# Patient Record
Sex: Male | Born: 1960 | Race: Black or African American | Hispanic: No | Marital: Single | Smoking: Current every day smoker
Health system: Southern US, Community
[De-identification: ages and names within clinical notes are randomized; demographics above are authoritative.]

## PROBLEM LIST (undated history)

## (undated) DIAGNOSIS — M109 Gout, unspecified: Secondary | ICD-10-CM

## (undated) DIAGNOSIS — I10 Essential (primary) hypertension: Secondary | ICD-10-CM

## (undated) HISTORY — DX: Gout, unspecified: M10.9

## (undated) HISTORY — DX: Essential (primary) hypertension: I10

---

## 2006-08-15 ENCOUNTER — Ambulatory Visit: Payer: Self-pay | Admitting: Family Medicine

## 2006-08-15 ENCOUNTER — Telehealth: Payer: Self-pay | Admitting: Family Medicine

## 2006-08-15 DIAGNOSIS — M79609 Pain in unspecified limb: Secondary | ICD-10-CM

## 2006-08-21 ENCOUNTER — Ambulatory Visit: Payer: Self-pay | Admitting: Family Medicine

## 2006-08-30 ENCOUNTER — Ambulatory Visit: Payer: Self-pay | Admitting: Family Medicine

## 2006-08-30 DIAGNOSIS — E669 Obesity, unspecified: Secondary | ICD-10-CM

## 2006-08-30 DIAGNOSIS — I10 Essential (primary) hypertension: Secondary | ICD-10-CM

## 2006-08-30 HISTORY — DX: Essential (primary) hypertension: I10

## 2006-09-25 ENCOUNTER — Encounter: Payer: Self-pay | Admitting: Family Medicine

## 2006-09-26 LAB — CONVERTED CEMR LAB
Albumin: 4.5 g/dL (ref 3.5–5.2)
Alkaline Phosphatase: 95 units/L (ref 39–117)
BUN: 16 mg/dL (ref 6–23)
CO2: 27 meq/L (ref 19–32)
Calcium: 10.4 mg/dL (ref 8.4–10.5)
Cholesterol: 186 mg/dL (ref 0–200)
Glucose, Bld: 84 mg/dL (ref 70–99)
HDL: 42 mg/dL (ref >39)
LDL Cholesterol: 119 mg/dL — ABNORMAL HIGH (ref 0–99)
Potassium: 4.4 meq/L (ref 3.5–5.3)
Triglycerides: 125 mg/dL (ref <150)

## 2006-12-03 ENCOUNTER — Ambulatory Visit: Payer: Self-pay | Admitting: Family Medicine

## 2006-12-03 DIAGNOSIS — M545 Low back pain: Secondary | ICD-10-CM

## 2006-12-20 ENCOUNTER — Telehealth (INDEPENDENT_AMBULATORY_CARE_PROVIDER_SITE_OTHER): Payer: Self-pay | Admitting: *Deleted

## 2006-12-20 ENCOUNTER — Encounter: Payer: Self-pay | Admitting: Family Medicine

## 2006-12-20 DIAGNOSIS — R252 Cramp and spasm: Secondary | ICD-10-CM | POA: Insufficient documentation

## 2006-12-21 ENCOUNTER — Telehealth (INDEPENDENT_AMBULATORY_CARE_PROVIDER_SITE_OTHER): Payer: Self-pay | Admitting: *Deleted

## 2006-12-24 ENCOUNTER — Encounter: Payer: Self-pay | Admitting: Family Medicine

## 2006-12-28 ENCOUNTER — Telehealth (INDEPENDENT_AMBULATORY_CARE_PROVIDER_SITE_OTHER): Payer: Self-pay | Admitting: *Deleted

## 2007-10-21 ENCOUNTER — Ambulatory Visit: Payer: Self-pay | Admitting: Family Medicine

## 2007-10-21 LAB — CONVERTED CEMR LAB
Alkaline Phosphatase: 99 units/L (ref 39–117)
BUN: 21 mg/dL (ref 6–23)
CO2: 21 meq/L (ref 19–32)
Cholesterol: 192 mg/dL (ref 0–200)
Glucose, Bld: 90 mg/dL (ref 70–99)
HDL: 47 mg/dL (ref >39)
LDL Cholesterol: 120 mg/dL — ABNORMAL HIGH (ref 0–99)
Total Bilirubin: 0.7 mg/dL (ref 0.3–1.2)
Triglycerides: 127 mg/dL (ref <150)
VLDL: 25 mg/dL (ref 0–40)

## 2007-10-22 ENCOUNTER — Encounter: Payer: Self-pay | Admitting: Family Medicine

## 2008-11-12 ENCOUNTER — Ambulatory Visit: Payer: Self-pay | Admitting: Family Medicine

## 2008-11-13 LAB — CONVERTED CEMR LAB

## 2010-04-25 ENCOUNTER — Ambulatory Visit: Payer: Self-pay | Admitting: Family Medicine

## 2010-04-25 DIAGNOSIS — N529 Male erectile dysfunction, unspecified: Secondary | ICD-10-CM

## 2010-04-27 LAB — CONVERTED CEMR LAB
ALT: 21 units/L (ref 0–53)
AST: 26 units/L (ref 0–37)
Alkaline Phosphatase: 79 units/L (ref 39–117)
BUN: 14 mg/dL (ref 6–23)
Calcium: 9.2 mg/dL (ref 8.4–10.5)
Chloride: 109 meq/L (ref 96–112)
Creatinine, Ser: 1.14 mg/dL (ref 0.40–1.50)
HDL: 40 mg/dL (ref >39)
LDL Cholesterol: 114 mg/dL — ABNORMAL HIGH (ref 0–99)
Magnesium: 1.9 mg/dL (ref 1.5–2.5)
Potassium: 4.6 meq/L (ref 3.5–5.3)
Sex Hormone Binding: 25 nmol/L (ref 13–71)
Testosterone Free: 61.3 pg/mL (ref 47.0–244.0)
Testosterone-% Free: 2.3 % (ref 1.6–2.9)
Total CHOL/HDL Ratio: 4.3
Uric Acid, Serum: 9.3 mg/dL — ABNORMAL HIGH (ref 4.0–7.8)

## 2010-07-19 ENCOUNTER — Ambulatory Visit: Payer: Self-pay | Admitting: Family Medicine

## 2010-10-16 LAB — CONVERTED CEMR LAB
BUN: 15 mg/dL (ref 6–23)
CO2: 22 meq/L (ref 19–32)
Calcium: 9.4 mg/dL (ref 8.4–10.5)
Creatinine, Ser: 1.24 mg/dL (ref 0.40–1.50)
Glucose, Bld: 90 mg/dL (ref 70–99)

## 2010-10-20 NOTE — Assessment & Plan Note (Signed)
Summary: f/u HTN   Vital Signs:  Patient profile:   50 year old male Height:      72 inches Weight:      256 pounds BMI:     34.85 O2 Sat:      96 % on Room air Pulse rate:   95 / minute BP sitting:   141 / 83  (left arm) Cuff size:   large  Vitals Entered By: Payton Spark CMA (April 25, 2010 8:29 AM)  O2 Flow:  Room air CC: F/U HTN, Hypertension Management   CC:  F/U HTN and Hypertension Management.  Hypertension History:      He complains of visual symptoms and side effects from treatment, but denies headache, chest pain, palpitations, dyspnea with exertion, peripheral edema, neurologic problems, and syncope.  He notes the following problems with antihypertensive medication side effects: Having some muscle cramps and bilat ankle edema.   Also having ED.        Positive major cardiovascular risk factors include male age 69 years old or older, hypertension, and current tobacco user.  Negative major cardiovascular risk factors include no history of diabetes or hyperlipidemia and negative family history for ischemic heart disease.        Further assessment for target organ damage reveals no history of ASHD, cardiac end-organ damage (CHF/LVH), stroke/TIA, peripheral vascular disease, renal insufficiency, or hypertensive retinopathy.     Current Medications (verified): 1)  Enalapril-Hydrochlorothiazide 10-25 Mg Tabs (Enalapril-Hydrochlorothiazide) .Marland Kitchen.. 1 Tab By Mouth Daily  Allergies (verified): 1)  ! Pcn  Past History:  Past Medical History: Reviewed history from 12/03/2006 and no changes required. HTN-   Family History: Reviewed history from 08/30/2006 and no changes required. Mother HTN, DM Father unknown sister died from brain aneurysm at 24 brother died of heart dz has 5 living sibblings  Social History: Reviewed history from 08/30/2006 and no changes required. 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  He is single, sexually  active.  Smokes 1 ppd x 27 yrs.  Does not exercise.  Eats a lot of fast food.  2 beers/ wk.  Review of Systems      See HPI  Physical Exam  General:  alert, well-developed, well-nourished, and well-hydrated.   Head:  normocephalic and atraumatic.   Eyes:  pupils equal, pupils round, and pupils reactive to light.   Mouth:  pharynx pink and moist.   Neck:  no masses.   Lungs:  Normal respiratory effort, chest expands symmetrically. Lungs are clear to auscultation, no crackles or wheezes. Heart:  Normal rate and regular rhythm. S1 and S2 normal without gallop, murmur, click, rub or other extra sounds. Msk:  localized edema without erythema over the R tibiotalar joint with normal gait Pulses:  2+ radial and pedal pulses Extremities:  no LE edema  Skin:  color normal.   Cervical Nodes:  No lymphadenopathy noted Psych:  good eye contact, not anxious appearing, and not depressed appearing.     Impression & Recommendations:  Problem # 1:  HYPERTENSION, BENIGN ESSENTIAL (ICD-401.1) BP improved but SBP is still >140 and he is having ED from Enalapril/ HCTZ.  I will change him to Bystolic 10 mg/ day -- samples given x 4 wks.  Call if ED has improved after 3 wks and will send in RX.  REcheck BP at CPE in 3 mos.  Continue to work on low sodium diet, exercise, wt loss and will need a dilated  eye exam to look for hypertensive retinopathy and screen for glaucoma.  Update fasting labs. His updated medication list for this problem includes:    Bystolic 10 Mg Tabs (Nebivolol hcl) .Marland Kitchen... 1 tab by mouth daily  Orders: T-Comprehensive Metabolic Panel [id number]) Ophthalmology Referral (Ophthalmology)  BP today: 141/83 Prior BP: 155/89 (11/12/2008)  10 Yr Risk Heart Disease: 18 %  Labs Reviewed: K+: 4.3 (10/21/2007) Creat: : 1.22 (10/21/2007)   Chol: 192 (10/21/2007)   HDL: 47 (10/21/2007)   LDL: 120 (10/21/2007)   TG: 127 (10/21/2007)  Problem # 2:  ERECTILE DYSFUNCTION, ORGANIC  (ICD-607.84) Likely med SE or from HTN and smoking.  Will check a testosterone with his labs and change his BP medication to see if this helps.  Encouraged smoking cessation.   Orders: T-Testosterone, Free and Total [phone])  Problem # 3:  MUSCLE CRAMPS (ICD-729.82) Likely from use of HCTZ with sweating at work.  Check electrolytes with a mag level today.  Stop HCTZ. Orders: T-Magnesium ([id number])  Problem # 4:  OBESITY NOS (ICD-278.00) BMI 34 but he is a muscular build.  Encouraged healthy diet, regular exercise.  Complete Medication List: 1)  Bystolic 10 Mg Tabs (Nebivolol hcl) .Marland Kitchen.. 1 tab by mouth daily  Other Orders: T-Lipid Profile ([id number]) T-Uric Acid (Blood) [phone])  Hypertension Assessment/Plan:      The patient's hypertensive risk group is category B: At least one risk factor (excluding diabetes) with no target organ damage.  His calculated 10 year risk of coronary heart disease is 18 %.  Today's blood pressure is 141/83.  His blood pressure goal is < 140/90.  Patient Instructions: 1)  Labs today. 2)  Will call you w/ results tomorrow. 3)  Change Enalapril/HCTZ to Bystolic - 1 tab daily for high BP. 4)  This should improve ED problems.  Let me know if you want to STAY on this for BP after 3 wks and I will send RX to your pharmacy. 5)  Return for a PHYSICAL in 3 mos.

## 2010-10-20 NOTE — Assessment & Plan Note (Signed)
Summary: f/u HTN   Vital Signs:  Patient profile:   50 year old male Height:      72 inches Weight:      260 pounds BMI:     35.39 O2 Sat:      97 % on Room air Pulse rate:   79 / minute BP sitting:   143 / 91  (left arm) Cuff size:   large  Vitals Entered By: Payton Spark CMA (July 19, 2010 10:31 AM)  O2 Flow:  Room air CC: F/U. Discuss BP meds., Hypertension Management   CC:  F/U. Discuss BP meds. and Hypertension Management.  Hypertension History:      He denies headache, chest pain, palpitations, dyspnea with exertion, orthopnea, PND, peripheral edema, and side effects from treatment.  He notes no problems with any antihypertensive medication side effects.  He is still smoking and having ED problems.  He refused to use testosterone even though it was low.  He felt better when he was on the Enalapril/ HCTZ.  Changing to Bystolic did not help the ED problems.  Marland Kitchen        Positive major cardiovascular risk factors include male age 27 years old or older, hypertension, and current tobacco user.  Negative major cardiovascular risk factors include no history of diabetes or hyperlipidemia and negative family history for ischemic heart disease.        Further assessment for target organ damage reveals no history of ASHD, cardiac end-organ damage (CHF/LVH), stroke/TIA, peripheral vascular disease, renal insufficiency, or hypertensive retinopathy.     Current Medications (verified): 1)  Bystolic 10 Mg Tabs (Nebivolol Hcl) .Marland Kitchen.. 1 Tab By Mouth Daily  Allergies (verified): 1)  ! Pcn  Past History:  Past Medical History: HTN-  ED  Family History: Reviewed history from 08/30/2006 and no changes required. Mother HTN, DM Father unknown sister died from brain aneurysm at 82 brother died of heart dz has 5 living sibblings  Social History: Reviewed history from 08/30/2006 and no changes required. 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  He is  single, sexually active.  Smokes 1 ppd x 27 yrs.  Does not exercise.  Eats a lot of fast food.  2 beers/ wk.  Review of Systems      See HPI  Physical Exam  General:  alert, well-developed, well-nourished, and well-hydrated.   Head:  normocephalic and atraumatic.   Eyes:  pupils equal, pupils round, and pupils reactive to light.   Mouth:  good dentition and pharynx pink and moist.   Neck:  no masses.   Lungs:  Normal respiratory effort, chest expands symmetrically. Lungs are clear to auscultation, no crackles or wheezes. Heart:  Normal rate and regular rhythm. S1 and S2 normal without gallop, murmur, click, rub or other extra sounds. Pulses:  2+ radial pulses Extremities:  no LE edema Skin:  color normal.     Impression & Recommendations:  Problem # 1:  HYPERTENSION, BENIGN ESSENTIAL (ICD-401.1) BP high on Bystolic and has not had any improvement in ED so will change him back to Enalapril/ HCTZ.  Labs UTD. CPE in 3 mos. His updated medication list for this problem includes:    Enalapril-hydrochlorothiazide 10-25 Mg Tabs (Enalapril-hydrochlorothiazide) .Marland Kitchen... 1 tab by mouth daily  BP today: 143/91 Prior BP: 141/83 (04/25/2010)  10 Yr Risk Heart Disease: 22 % Prior 10 Yr Risk Heart Disease: 18 % (04/25/2010)  Labs Reviewed: K+: 4.6 (04/25/2010) Creat: :  1.14 (04/25/2010)   Chol: 172 (04/25/2010)   HDL: 40 (04/25/2010)   LDL: 114 (04/25/2010)   TG: 90 (04/25/2010)  Problem # 2:  ERECTILE DYSFUNCTION, ORGANIC (ICD-607.84) Did not improve with change of BP medicine.  Had a low testosterone but he refused treatment. His smoking and HTN are likely contributing factors.  Will try Cialis as needed.  Counseled on wt loss and smoking cessation. His updated medication list for this problem includes:    Cialis 20 Mg Tabs (Tadalafil) .Marland Kitchen... 1 tab by mouth x 1 prn  Complete Medication List: 1)  Enalapril-hydrochlorothiazide 10-25 Mg Tabs (Enalapril-hydrochlorothiazide) .Marland Kitchen.. 1 tab by mouth  daily 2)  Cialis 20 Mg Tabs (Tadalafil) .Marland Kitchen.. 1 tab by mouth x 1 prn  Hypertension Assessment/Plan:      The patient's hypertensive risk group is category B: At least one risk factor (excluding diabetes) with no target organ damage.  His calculated 10 year risk of coronary heart disease is 22 %.  Today's blood pressure is 143/91.  His blood pressure goal is < 140/90.  Patient Instructions: 1)  Change BP medicine back to Enalapril/ HCTZ once daily. 2)  Try Cialis for ED.  You can cut in 1/2 and take 30 min prior to sexual activity. 3)  Work on smoking cessation. 4)  Set up a PHYSICAL in 3 mos. Prescriptions: CIALIS 20 MG TABS (TADALAFIL) 1 tab by mouth x 1 prn  #15 x 2   Entered and Authorized by:   Seymour Bars DO   Signed by:   Seymour Bars DO on 07/19/2010   Method used:   Electronically to        [facility] [phone][facility] (retail)       [address]       [place], [place]  [address]       Ph: [phone]       Fax: [phone]   RxID:   [id number] ENALAPRIL-HYDROCHLOROTHIAZIDE 10-25 MG TABS (ENALAPRIL-HYDROCHLOROTHIAZIDE) 1 tab by mouth daily  #90 x 1   Entered and Authorized by:   Seymour Bars DO   Signed by:   Seymour Bars DO on 07/19/2010   Method used:   Electronically to        [facility] [phone][facility] (retail)       [address]       [place], [place]  [address]       Ph: [phone]       Fax: [phone]   RxID:   [id number]    Orders Added: 1)  Est. Patient Level III [09323]

## 2010-11-02 ENCOUNTER — Ambulatory Visit (INDEPENDENT_AMBULATORY_CARE_PROVIDER_SITE_OTHER): Payer: BC Managed Care – PPO | Admitting: Family Medicine

## 2010-11-02 ENCOUNTER — Encounter: Payer: Self-pay | Admitting: Family Medicine

## 2010-11-02 DIAGNOSIS — J069 Acute upper respiratory infection, unspecified: Secondary | ICD-10-CM | POA: Insufficient documentation

## 2010-11-09 NOTE — Assessment & Plan Note (Signed)
Summary: URI   Vital Signs:  Patient profile:   50 year old male Height:      72 inches Weight:      258 pounds BMI:     35.12 O2 Sat:      96 % on Room air Temp:     98.4 degrees F oral Pulse rate:   115 / minute BP sitting:   157 / 93  (left arm) Cuff size:   large  Vitals Entered By: Payton Spark CMA (November 02, 2010 8:49 AM)  O2 Flow:  Room air CC: Head and chest congestion x weeks.   Primary Care Provider:  Seymour Bars DO  CC:  Head and chest congestion x weeks.Marland Kitchen  History of Present Illness: 50 yo AAM presents for congestion x 3 wks.  He is having a lot of rhinorrhea mixed with some blood.  Taking Nyquil and nothing during the day.  He has not had fevers or GI upset.  no  sore throat.  He has a little dry cough but no SOB or chest tightness.  Denies ocular or ear symptoms.  He has not been taking his BP meds the past 3 days.    Current Medications (verified): 1)  Enalapril-Hydrochlorothiazide 10-25 Mg Tabs (Enalapril-Hydrochlorothiazide) .Marland Kitchen.. 1 Tab By Mouth Daily 2)  Cialis 20 Mg Tabs (Tadalafil) .Marland Kitchen.. 1 Tab By Mouth X 1 Prn  Allergies (verified): 1)  ! Pcn  Past History:  Past Medical History: Reviewed history from 07/19/2010 and no changes required. HTN-  ED  Family History: Reviewed history from 08/30/2006 and no changes required. Mother HTN, DM Father unknown sister died from brain aneurysm at 39 brother died of heart dz has 5 living sibblings  Social History: Reviewed history from 08/30/2006 and no changes required. 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  He is single, sexually active.  Smokes 1 ppd x 27 yrs.  Does not exercise.  Eats a lot of fast food.  2 beers/ wk.  Review of Systems      See HPI  Physical Exam  General:  alert, well-developed, well-nourished, and well-hydrated.   Head:  sinuses NTTP Eyes:  conjunctiva clear Ears:  EACs patent; TMs translucent and gray with good cone of light and bony  landmarks.  Nose:  nasal congestion, boggy turbinates Mouth:  o/p mildly injected Lungs:  Normal respiratory effort, chest expands symmetrically. Lungs are clear to auscultation, no crackles or wheezes. Heart:  Normal rate and regular rhythm. S1 and S2 normal without gallop, murmur, click, rub or other extra sounds. Skin:  color normal.   Cervical Nodes:  shoddy anterior cervical LN enlargement   Impression & Recommendations:  Problem # 1:  VIRAL URI (ICD-465.9)  Instructed on symptomatic treatment. Call if symptoms persist or worsen.   REstart BP meds and use Mucinex D in the morning for short term use only ( 7 days).  Nasal saline given. OK to use Nyquil at night and call if not improving after 7 days.  Complete Medication List: 1)  Enalapril-hydrochlorothiazide 10-25 Mg Tabs (Enalapril-hydrochlorothiazide) .Marland Kitchen.. 1 tab by mouth daily 2)  Cialis 20 Mg Tabs (Tadalafil) .Marland Kitchen.. 1 tab by mouth x 1 prn  Patient Instructions: 1)  Take your Enalapril everyday. 2)  Take OTC Mucinex -D in the morning (get from behind the pharmacy counter) and Nyquil is OK for nighttime. 3)  If not feeling better or getting worse after 7 days, please call me.  Orders Added: 1)  Est. Patient Level III [81859]

## 2010-12-02 ENCOUNTER — Telehealth (INDEPENDENT_AMBULATORY_CARE_PROVIDER_SITE_OTHER): Payer: Self-pay | Admitting: *Deleted

## 2010-12-15 NOTE — Progress Notes (Signed)
Summary: Sinus infection?  Phone Note Call from Patient Call back at [facility] Phone [phone]   Caller: Patient Reason for Call: Talk to Nurse Summary of Call: pt is still having a lot of congestion, mucus has changed colors, can Rx be called in for sinus infection? Initial call taken by: Lannette Donath,  December 02, 2010 3:25 PM  Follow-up for Phone Call        [facility] to pt.  Apologized for delay in return call.  Pt states he went to [facility] on Friday and received abx from them.  Pt to call with any other problems. Follow-up by: Francee Piccolo CMA Duncan Dull),  December 05, 2010 3:50 PM

## 2011-02-03 ENCOUNTER — Ambulatory Visit: Payer: BC Managed Care – PPO | Admitting: Family Medicine

## 2011-03-21 ENCOUNTER — Other Ambulatory Visit: Payer: Self-pay | Admitting: Family Medicine

## 2011-11-23 ENCOUNTER — Ambulatory Visit (INDEPENDENT_AMBULATORY_CARE_PROVIDER_SITE_OTHER): Payer: BC Managed Care – PPO | Admitting: Family Medicine

## 2011-11-23 ENCOUNTER — Encounter: Payer: Self-pay | Admitting: Family Medicine

## 2011-11-23 DIAGNOSIS — M549 Dorsalgia, unspecified: Secondary | ICD-10-CM

## 2011-11-23 DIAGNOSIS — Z87448 Personal history of other diseases of urinary system: Secondary | ICD-10-CM

## 2011-11-23 DIAGNOSIS — R319 Hematuria, unspecified: Secondary | ICD-10-CM

## 2011-11-23 DIAGNOSIS — I10 Essential (primary) hypertension: Secondary | ICD-10-CM

## 2011-11-23 MED ORDER — ENALAPRIL-HYDROCHLOROTHIAZIDE 10-25 MG PO TABS: 1.0000 | ORAL_TABLET | Freq: Every day | ORAL | Status: DC

## 2011-11-23 MED ORDER — MELOXICAM 15 MG PO TABS: 15.0000 mg | ORAL_TABLET | Freq: Every day | ORAL | Status: DC

## 2011-11-23 MED ORDER — ORPHENADRINE CITRATE ER 100 MG PO TB12: 100.0000 mg | ORAL_TABLET | Freq: Two times a day (BID) | ORAL | Status: DC

## 2011-11-23 NOTE — Progress Notes (Signed)
  Subjective:    Patient ID: Phillip Wyatt, male    DOB: 1961-03-27, 51 y.o.   MRN: [id number]  Hypertension This is a new problem. The current episode started more than 1 year ago. The problem is unchanged. Pertinent negatives include no anxiety, blurred vision, chest pain, headaches, malaise/fatigue, neck pain, orthopnea, palpitations, peripheral edema, PND, shortness of breath or sweats. Risk factors for coronary artery disease include male gender, smoking/tobacco exposure, sedentary lifestyle and stress. Past treatments include ACE inhibitors and diuretics. Compliance problems include exercise and medication side effects.  There is no history of angina, kidney disease, CAD/MI, CVA, heart failure, left ventricular hypertrophy, renovascular disease, retinopathy or a thyroid problem. There is no history of chronic renal disease, coarctation of the aorta, hyperaldosteronism, hypercortisolism, hyperparathyroidism, a hypertension causing med, pheochromocytoma or sleep apnea.   Admits he has not been compliant w/his medication   Review of Systems  Constitutional: Negative for malaise/fatigue.  HENT: Negative for neck pain.   Eyes: Negative for blurred vision.  Respiratory: Negative for shortness of breath.   Cardiovascular: Negative for chest pain, palpitations, orthopnea and PND.  Genitourinary: Positive for hematuria.       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       L back pain  Neurological: Negative for headaches.      BP 138/83  Pulse 103  Ht 6' (1.829 m)  Wt 265 lb (120.203 kg)  BMI 35.94 kg/m2  SpO2 97% Objective:   Physical Exam  Constitutional: He is oriented to person, place, and time. He appears well-developed. He appears distressed.  HENT:  Head: Normocephalic.  Neck: Normal range of motion. Neck supple.  Cardiovascular: Normal rate and regular rhythm.   Musculoskeletal:       No tenderness over R hip and back    Neurological: He is alert and oriented to person, place, and time. No cranial nerve deficit.  Skin: Skin is warm and dry.  Psychiatric: He has a normal mood and affect.          Assessment & Plan:  #1 Back pain   Norflex prn at night only and mobic 15 mg prn #2 hematuria  Check U/A and UC/S # 3 Hypertension continue w/verapamil

## 2011-11-23 NOTE — Patient Instructions (Signed)
Hypertension As your heart beats, it forces blood through your arteries. This force is your blood pressure. If the pressure is too high, it is called hypertension (HTN) or high blood pressure. HTN is dangerous because you may have it and not know it. High blood pressure may mean that your heart has to work harder to pump blood. Your arteries may be narrow or stiff. The extra work puts you at risk for heart disease, stroke, and other problems.  Blood pressure consists of two numbers, a higher number over a lower, 110/72, for example. It is stated as "110 over 72." The ideal is below 120 for the top number (systolic) and under 80 for the bottom (diastolic). Write down your blood pressure today. You should pay close attention to your blood pressure if you have certain conditions such as:  Heart failure.   Prior heart attack.   Diabetes   Chronic kidney disease.   Prior stroke.   Multiple risk factors for heart disease.  To see if you have HTN, your blood pressure should be measured while you are seated with your arm held at the level of the heart. It should be measured at least twice. A one-time elevated blood pressure reading (especially in the Emergency Department) does not mean that you need treatment. There may be conditions in which the blood pressure is different between your right and left arms. It is important to see your caregiver soon for a recheck. Most people have essential hypertension which means that there is not a specific cause. This type of high blood pressure may be lowered by changing lifestyle factors such as:  Stress.   Smoking.   Lack of exercise.   Excessive weight.   Drug/tobacco/alcohol use.   Eating less salt.  Most people do not have symptoms from high blood pressure until it has caused damage to the body. Effective treatment can often prevent, delay or reduce that damage. TREATMENT  When a cause has been identified, treatment for high blood pressure is  directed at the cause. There are a large number of medications to treat HTN. These fall into several categories, and your caregiver will help you select the medicines that are best for you. Medications may have side effects. You should review side effects with your caregiver. If your blood pressure stays high after you have made lifestyle changes or started on medicines,   Your medication(s) may need to be changed.   Other problems may need to be addressed.   Be certain you understand your prescriptions, and know how and when to take your medicine.   Be sure to follow up with your caregiver within the time frame advised (usually within two weeks) to have your blood pressure rechecked and to review your medications.   If you are taking more than one medicine to lower your blood pressure, make sure you know how and at what times they should be taken. Taking two medicines at the same time can result in blood pressure that is too low.  SEEK IMMEDIATE MEDICAL CARE IF:  You develop a severe headache, blurred or changing vision, or confusion.   You have unusual weakness or numbness, or a faint feeling.   You have severe chest or abdominal pain, vomiting, or breathing problems.  MAKE SURE YOU:   Understand these instructions.   Will watch your condition.   Will get help right away if you are not doing well or get worse.  Document Released: 09/04/2005 Document Revised: 08/24/2011 Document Reviewed:   04/24/2008 [employer] Patient Information 2012 [place], [place].Hematuria, Adult Hematuria (blood in your urine) can be caused by a bladder infection (cystitis), kidney infection (pyelonephritis), prostate infection (prostatitis), or kidney stone. Infections will usually respond to antibiotics (medications which kill germs), and a kidney stone will usually pass through your urine without further treatment. If you were put on antibiotics, take all the medicine until gone. You may feel better in a few days,  but take all of your medicine or the infection may not respond and become more difficult to treat. If antibiotics were not given, an infection did not cause the blood in the urine. A further work up to find out the reason may be needed. HOME CARE INSTRUCTIONS   Drink lots of fluid, 3 to 4 quarts a day. If you have been diagnosed with an infection, cranberry juice is especially recommended, in addition to large amounts of water.   Avoid caffeine, tea, and carbonated beverages, because they tend to irritate the bladder.   Avoid alcohol as it may irritate the prostate.   Only take over-the-counter or prescription medicines for pain, discomfort, or fever as directed by your caregiver.   If you have been diagnosed with a kidney stone follow your caregivers instructions regarding straining your urine to catch the stone.  TO PREVENT FURTHER INFECTIONS:  Empty the bladder often. Avoid holding urine for long periods of time.   After a bowel movement, women should cleanse front to back. Use each tissue only once.   Empty the bladder before and after sexual intercourse if you are a male.   Return to your caregiver if you develop back pain, fever, nausea (feeling sick to your stomach), vomiting, or your symptoms (problems) are not better in 3 days. Return sooner if you are getting worse.  If you have been requested to return for further testing make sure to keep your appointments. If an infection is not the cause of blood in your urine, X-rays may be required. Your caregiver will discuss this with you. SEEK IMMEDIATE MEDICAL CARE IF:   You have a persistent fever over 102 F (38.9 C).   You develop severe vomiting and are unable to keep the medication down.   You develop severe back or abdominal pain despite taking your medications.   You begin passing a large amount of blood or clots in your urine.   You feel extremely weak or faint, or pass out.  MAKE SURE YOU:   Understand these  instructions.   Will watch your condition.   Will get help right away if you are not doing well or get worse.  Document Released: 09/04/2005 Document Revised: 08/24/2011 Document Reviewed: 04/23/2008 [facility] Patient Information 2012 [place], [place].

## 2011-11-24 LAB — URINALYSIS, ROUTINE W REFLEX MICROSCOPIC
Glucose, UA: NEGATIVE mg/dL
Hgb urine dipstick: NEGATIVE
Ketones, ur: NEGATIVE mg/dL
Leukocytes, UA: NEGATIVE
pH: 6.5 (ref 5.0–8.0)

## 2011-11-25 LAB — URINE CULTURE: Organism ID, Bacteria: NO GROWTH

## 2012-01-23 ENCOUNTER — Ambulatory Visit (INDEPENDENT_AMBULATORY_CARE_PROVIDER_SITE_OTHER): Payer: BC Managed Care – PPO | Admitting: Family Medicine

## 2012-01-23 ENCOUNTER — Encounter: Payer: Self-pay | Admitting: Family Medicine

## 2012-01-23 VITALS — BP 145/84 | HR 117 | Ht 72.0 in | Wt 263.0 lb

## 2012-01-23 DIAGNOSIS — Z0000 Encounter for general adult medical examination without abnormal findings: Secondary | ICD-10-CM

## 2012-01-23 DIAGNOSIS — Z23 Encounter for immunization: Secondary | ICD-10-CM

## 2012-01-23 DIAGNOSIS — E291 Testicular hypofunction: Secondary | ICD-10-CM

## 2012-01-23 DIAGNOSIS — E790 Hyperuricemia without signs of inflammatory arthritis and tophaceous disease: Secondary | ICD-10-CM

## 2012-01-23 DIAGNOSIS — F17200 Nicotine dependence, unspecified, uncomplicated: Secondary | ICD-10-CM

## 2012-01-23 DIAGNOSIS — M109 Gout, unspecified: Secondary | ICD-10-CM

## 2012-01-23 DIAGNOSIS — R5383 Other fatigue: Secondary | ICD-10-CM

## 2012-01-23 DIAGNOSIS — I10 Essential (primary) hypertension: Secondary | ICD-10-CM

## 2012-01-23 DIAGNOSIS — E349 Endocrine disorder, unspecified: Secondary | ICD-10-CM

## 2012-01-23 DIAGNOSIS — E785 Hyperlipidemia, unspecified: Secondary | ICD-10-CM

## 2012-01-23 DIAGNOSIS — Z720 Tobacco use: Secondary | ICD-10-CM

## 2012-01-23 LAB — CBC WITH DIFFERENTIAL/PLATELET
Basophils Absolute: 0 10*3/uL (ref 0.0–0.1)
Basophils Relative: 1 % (ref 0–1)
Eosinophils Absolute: 0.1 10*3/uL (ref 0.0–0.7)
Eosinophils Relative: 1 % (ref 0–5)
MCH: 29.3 pg (ref 26.0–34.0)
MCHC: 32.7 g/dL (ref 30.0–36.0)
MCV: 89.6 fL (ref 78.0–100.0)
Monocytes Absolute: 0.5 10*3/uL (ref 0.1–1.0)
Platelets: 246 10*3/uL (ref 150–400)
RDW: 12.8 % (ref 11.5–15.5)
WBC: 7.3 10*3/uL (ref 4.0–10.5)

## 2012-01-23 LAB — COMPLETE METABOLIC PANEL WITH GFR
ALT: 23 U/L (ref 0–53)
AST: 26 U/L (ref 0–37)
Albumin: 4.3 g/dL (ref 3.5–5.2)
BUN: 14 mg/dL (ref 6–23)
Calcium: 9.7 mg/dL (ref 8.4–10.5)
Chloride: 104 mEq/L (ref 96–112)
Potassium: 4.1 mEq/L (ref 3.5–5.3)
Sodium: 139 mEq/L (ref 135–145)
Total Protein: 7.4 g/dL (ref 6.0–8.3)

## 2012-01-23 LAB — LIPID PANEL
Cholesterol: 186 mg/dL (ref 0–200)
VLDL: 39 mg/dL (ref 0–40)

## 2012-01-23 LAB — URIC ACID: Uric Acid, Serum: 9.8 mg/dL — ABNORMAL HIGH (ref 4.0–7.8)

## 2012-01-23 MED ORDER — VARENICLINE TARTRATE 1 MG PO TABS: 1.0000 mg | ORAL_TABLET | Freq: Two times a day (BID) | ORAL | Status: AC

## 2012-01-23 MED ORDER — VARENICLINE TARTRATE 0.5 MG X 11 & 1 MG X 42 PO MISC: ORAL | Status: AC

## 2012-01-23 MED ORDER — OLMESARTAN-AMLODIPINE-HCTZ 20-5-12.5 MG PO TABS: 1.0000 | ORAL_TABLET | Freq: Every day | ORAL | Status: DC

## 2012-01-23 NOTE — Progress Notes (Signed)
Subjective:    Patient ID: Phillip Wyatt, male    DOB: [birth year], 51 y.o.   MRN: [id number] #1 Hypertension This is a chronic problem. The current episode started more than 1 year ago. The problem has been waxing and waning since onset. The problem is uncontrolled. Associated symptoms include malaise/fatigue and peripheral edema. Pertinent negatives include no blurred vision, chest pain, headaches, neck pain, orthopnea, palpitations, shortness of breath or sweats. There are no associated agents to hypertension. Risk factors for coronary artery disease include sedentary lifestyle, male gender, smoking/tobacco exposure, obesity, family history, diabetes mellitus and dyslipidemia. Past treatments include ACE inhibitors and diuretics. The current treatment provides mild improvement. Compliance problems include exercise.  There is no history of angina, kidney disease, CAD/MI, CVA, heart failure, left ventricular hypertrophy, PVD, renovascular disease, retinopathy or a thyroid problem. There is no history of chronic renal disease, hyperaldosteronism, hypercortisolism or a hypertension causing med.  #2 smoking cessation. Patient requests help with smoking cessation. 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 He has never used Chantix before. #3 who #3 fatigue. He has had some erectile dysfunction and fatigue. In reviewing his chart back in 2010 he had low testosterone which apparently was never addressed. We will get testosterone levels to see if that may be the cause of erectile dysfunction and the fatigue. #4 health maintenance. 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 Will need to reevaluate elevated uric acid and cholesterol   found on lab review. #5 hematuria. Urine was cleared that was checked last visit culture was negative. #6 immunization needs.  Review of Systems  Constitutional: Positive for  malaise/fatigue.  HENT: Negative for neck pain.   Eyes: Negative for blurred vision.  Respiratory: Negative for shortness of breath.   Cardiovascular: Positive for leg swelling. Negative for chest pain, palpitations and orthopnea.  Musculoskeletal: Negative for back pain.  Neurological: Negative for headaches.      BP 145/84  Pulse 117  Ht 6' (1.829 m)  Wt 263 lb (119.296 kg)  BMI 35.67 kg/m2 Objective:   Physical Exam  Constitutional: He is oriented to person, place, and time. He appears well-developed and well-nourished. No distress.  HENT:  Head: Normocephalic and atraumatic.  Neck: Neck supple.  Cardiovascular: Normal rate, regular rhythm and normal heart sounds.   Pulmonary/Chest: Effort normal and breath sounds normal. No respiratory distress. He has no wheezes.  Neurological: He is alert and oriented to person, place, and time.  Skin: Skin is warm and dry.  Psychiatric: He has a normal mood and affect. His behavior is normal.      Results for orders placed in visit on 11/23/11  URINALYSIS, ROUTINE W REFLEX MICROSCOPIC      Component Value Range   Color, Urine YELLOW  YELLOW    APPearance CLEAR  CLEAR    Specific Gravity, Urine 1.021  1.005 - 1.030    pH 6.5  5.0 - 8.0    Glucose, UA NEG  NEG (mg/dL)   Bilirubin Urine NEG  NEG    Ketones, ur NEG  NEG (mg/dL)   Hgb urine dipstick NEG  NEG    Protein, ur NEG  NEG (mg/dL)   Urobilinogen, UA 1  0.0 - 1.0 (mg/dL)   Nitrite NEG  NEG    Leukocytes, UA NEG  NEG   URINE CULTURE      Component Value Range   Colony Count NO GROWTH  Organism ID, Bacteria NO GROWTH     Assessment & Plan:   #1 hypertension. Blood pressure is higher than I like we will stop the Vaseretic . Place on Tribenzor 20/5/12.5 samples will be given as well as prescription.  #2 smoking cessation. Starts on Chantix. Use as directed.  #3 fatigue sexual dysfunction. Check testosterone level #4 health maintenance have her return in 2 months for  complete physical. It is obvious things we need to treat or adjust we'll do it before he comes back for his physical   #5 health maintenance. Since he has had some swelling in his foot will check uric acid and consider gout treatment and cholesterol treatment if needed. #6 back pain& hematuria appears to be resolved.  #7 tetanus will be given as well.

## 2012-01-23 NOTE — Patient Instructions (Signed)
Gout Gout is an inflammatory condition (arthritis) caused by a buildup of uric acid crystals in the joints. Uric acid is a chemical that is normally present in the blood. Under some circumstances, uric acid can form into crystals in your joints. This causes joint redness, soreness, and swelling (inflammation). Repeat attacks are common. Over time, uric acid crystals can form into masses (tophi) near a joint, causing disfigurement. Gout is treatable and often preventable. CAUSES  The disease begins with elevated levels of uric acid in the blood. Uric acid is produced by your body when it breaks down a naturally found substance called purines. This also happens when you eat certain foods such as meats and fish. Causes of an elevated uric acid level include:  Being passed down from parent to child (heredity).   Diseases that cause increased uric acid production (obesity, psoriasis, some cancers).   Excessive alcohol use.   Diet, especially diets rich in meat and seafood.   Medicines, including certain cancer-fighting drugs (chemotherapy), diuretics, and aspirin.   Chronic kidney disease. The kidneys are no longer able to remove uric acid well.   Problems with metabolism.  Conditions strongly associated with gout include:  Obesity.   High blood pressure.   High cholesterol.   Diabetes.  Not everyone with elevated uric acid levels gets gout. It is not understood why some people get gout and others do not. Surgery, joint injury, and eating too much of certain foods are some of the factors that can lead to gout. SYMPTOMS   An attack of gout comes on quickly. It causes intense pain with redness, swelling, and warmth in a joint.   Fever can occur.   Often, only one joint is involved. Certain joints are more commonly involved:   Base of the big toe.   Knee.   Ankle.   Wrist.   Finger.  Without treatment, an attack usually goes away in a few days to weeks. Between attacks, you  usually will not have symptoms, which is different from many other forms of arthritis. DIAGNOSIS  Your caregiver will suspect gout based on your symptoms and exam. Removal of fluid from the joint (arthrocentesis) is done to check for uric acid crystals. Your caregiver will give you a medicine that numbs the area (local anesthetic) and use a needle to remove joint fluid for exam. Gout is confirmed when uric acid crystals are seen in joint fluid, using a special microscope. Sometimes, blood, urine, and X-ray tests are also used. TREATMENT  There are 2 phases to gout treatment: treating the sudden onset (acute) attack and preventing attacks (prophylaxis). Treatment of an Acute Attack  Medicines are used. These include anti-inflammatory medicines or steroid medicines.   An injection of steroid medicine into the affected joint is sometimes necessary.   The painful joint is rested. Movement can worsen the arthritis.   You may use warm or cold treatments on painful joints, depending which works best for you.   Discuss the use of coffee, vitamin C, or cherries with your caregiver. These may be helpful treatment options.  Treatment to Prevent Attacks After the acute attack subsides, your caregiver may advise prophylactic medicine. These medicines either help your kidneys eliminate uric acid from your body or decrease your uric acid production. You may need to stay on these medicines for a very long time. The early phase of treatment with prophylactic medicine can be associated with an increase in acute gout attacks. For this reason, during the first few months   of treatment, your caregiver may also advise you to take medicines usually used for acute gout treatment. Be sure you understand your caregiver's directions. You should also discuss dietary treatment with your caregiver. Certain foods such as meats and fish can increase uric acid levels. Other foods such as dairy can decrease levels. Your caregiver  can give you a list of foods to avoid. HOME CARE INSTRUCTIONS   Do not take aspirin to relieve pain. This raises uric acid levels.   Only take over-the-counter or prescription medicines for pain, discomfort, or fever as directed by your caregiver.   Rest the joint as much as possible. When in bed, keep sheets and blankets off painful areas.   Keep the affected joint raised (elevated).   Use crutches if the painful joint is in your leg.   Drink enough water and fluids to keep your urine clear or pale yellow. This helps your body get rid of uric acid. Do not drink alcoholic beverages. They slow the passage of uric acid.   Follow your caregiver's dietary instructions. Pay careful attention to the amount of protein you eat. Your daily diet should emphasize fruits, vegetables, whole grains, and fat-free or low-fat milk products.   Maintain a healthy body weight.  SEEK MEDICAL CARE IF:   You have an oral temperature above 102 F (38.9 C).   You develop diarrhea, vomiting, or any side effects from medicines.   You do not feel better in 24 hours, or you are getting worse.  SEEK IMMEDIATE MEDICAL CARE IF:   Your joint becomes suddenly more tender and you have:   Chills.   An oral temperature above 102 F (38.9 C), not controlled by medicine.  MAKE SURE YOU:   Understand these instructions.   Will watch your condition.   Will get help right away if you are not doing well or get worse.  Document Released: 09/01/2000 Document Revised: 08/24/2011 Document Reviewed: 12/13/2009 [facility] Patient Information 2012 [place], [place].Smoking Cessation, Tips for Success YOU CAN QUIT SMOKING If you are ready to quit smoking, congratulations! You have chosen to help yourself be healthier. Cigarettes bring nicotine, tar, carbon monoxide, and other irritants into your body. Your lungs, heart, and blood vessels will be able to work better without these poisons. There are many different ways to quit  smoking. Nicotine gum, nicotine patches, a nicotine inhaler, or nicotine nasal spray can help with physical craving. Hypnosis, support groups, and medicines help break the habit of smoking. Here are some tips to help you quit for good.  Throw away all cigarettes.   Clean and remove all ashtrays from your home, work, and car.   On a card, write down your reasons for quitting. Carry the card with you and read it when you get the urge to smoke.   Cleanse your body of nicotine. Drink enough water and fluids to keep your urine clear or pale yellow. Do this after quitting to flush the nicotine from your body.   Learn to predict your moods. Do not let a bad situation be your excuse to have a cigarette. Some situations in your life might tempt you into wanting a cigarette.   Never have "just one" cigarette. It leads to wanting another and another. Remind yourself of your decision to quit.   Change habits associated with smoking. If you smoked while driving or when feeling stressed, try other activities to replace smoking. Stand up when drinking your coffee. Brush your teeth after eating. Sit in a  different chair when you read the paper. Avoid alcohol while trying to quit, and try to drink fewer caffeinated beverages. Alcohol and caffeine may urge you to smoke.   Avoid foods and drinks that can trigger a desire to smoke, such as sugary or spicy foods and alcohol.   Ask people who smoke not to smoke around you.   Have something planned to do right after eating or having a cup of coffee. Take a walk or exercise to perk you up. This will help to keep you from overeating.   Try a relaxation exercise to calm you down and decrease your stress. Remember, you may be tense and nervous for the first 2 weeks after you quit, but this will pass.   Find new activities to keep your hands busy. Play with a pen, coin, or rubber band. Doodle or draw things on paper.   Brush your teeth right after eating. This will  help cut down on the craving for the taste of tobacco after meals. You can try mouthwash, too.   Use oral substitutes, such as lemon drops, carrots, a cinnamon stick, or chewing gum, in place of cigarettes. Keep them handy so they are available when you have the urge to smoke.   When you have the urge to smoke, try deep breathing.   Designate your home as a nonsmoking area.   If you are a heavy smoker, ask your caregiver about a prescription for nicotine chewing gum. It can ease your withdrawal from nicotine.   Reward yourself. Set aside the cigarette money you save and buy yourself something nice.   Look for support from others. Join a support group or smoking cessation program. Ask someone at home or at work to help you with your plan to quit smoking.   Always ask yourself, "Do I need this cigarette or is this just a reflex?" Tell yourself, "Today, I choose not to smoke," or "I do not want to smoke." You are reminding yourself of your decision to quit, even if you do smoke a cigarette.  HOW WILL I FEEL WHEN I QUIT SMOKING?  The benefits of not smoking start within days of quitting.   You may have symptoms of withdrawal because your body is used to nicotine (the addictive substance in cigarettes). You may crave cigarettes, be irritable, feel very hungry, cough often, get headaches, or have difficulty concentrating.   The withdrawal symptoms are only temporary. They are strongest when you first quit but will go away within 10 to 14 days.   When withdrawal symptoms occur, stay in control. Think about your reasons for quitting. Remind yourself that these are signs that your body is healing and getting used to being without cigarettes.   Remember that withdrawal symptoms are easier to treat than the major diseases that smoking can cause.   Even after the withdrawal is over, expect periodic urges to smoke. However, these cravings are generally short-lived and will go away whether you smoke or  not. Do not smoke!   If you relapse and smoke again, do not lose hope. Most smokers quit 3 times before they are successful.   If you relapse, do not give up! Plan ahead and think about what you will do the next time you get the urge to smoke.  LIFE AS A NONSMOKER: MAKE IT FOR A MONTH, MAKE IT FOR LIFE Day 1: Hang this page where you will see it every day. Day 2: Get rid of all ashtrays, matches, and  lighters. Day 3: Drink water. Breathe deeply between sips. Day 4: Avoid places with smoke-filled air, such as bars, clubs, or the smoking section of restaurants. Day 5: Keep track of how much money you save by not smoking. Day 6: Avoid boredom. Keep a good book with you or go to the movies. Day 7: Reward yourself! One week without smoking! Day 8: Make a dental appointment to get your teeth cleaned. Day 9: Decide how you will turn down a cigarette before it is offered to you. Day 10: Review your reasons for quitting. Day 11: Distract yourself. Stay active to keep your mind off smoking and to relieve tension. Take a walk, exercise, read a book, do a crossword puzzle, or try a new hobby. Day 12: Exercise. Get off the bus before your stop or use stairs instead of escalators. Day 13: Call on friends for support and encouragement. Day 14: Reward yourself! Two weeks without smoking! Day 15: Practice deep breathing exercises. Day 16: Bet a friend that you can stay a nonsmoker. Day 17: Ask to sit in nonsmoking sections of restaurants. Day 18: Hang up "No Smoking" signs. Day 19: Think of yourself as a nonsmoker. Day 20: Each morning, tell yourself you will not smoke. Day 21: Reward yourself! Three weeks without smoking! Day 22: Think of smoking in negative ways. Remember how it stains your teeth, gives you bad breath, and leaves you short of breath. Day 23: Eat a nutritious breakfast. Day 24:Do not relive your days as a smoker. Day 25: Hold a pencil in your hand when talking on the telephone. Day  26: Tell all your friends you do not smoke. Day 27: Think about how much better food tastes. Day 28: Remember, one cigarette is one too many. Day 29: Take up a hobby that will keep your hands busy. Day 30: Congratulations! One month without smoking! Give yourself a big reward. Your caregiver can direct you to community resources or hospitals for support, which may include:  Group support.   Education.   Hypnosis.   Subliminal therapy.  Document Released: 06/02/2004 Document Revised: 08/24/2011 Document Reviewed: 06/21/2009 [facility] Patient Information 2012 [place], [place].Smoking Cessation This document explains the best ways for you to quit smoking and new treatments to help. It lists new medicines that can double or triple your chances of quitting and quitting for good. It also considers ways to avoid relapses and concerns you may have about quitting, including weight gain. NICOTINE: A POWERFUL ADDICTION If you have tried to quit smoking, you know how hard it can be. It is hard because nicotine is a very addictive drug. For some people, it can be as addictive as heroin or cocaine. Usually, people make 2 or 3 tries, or more, before finally being able to quit. Each time you try to quit, you can learn about what helps and what hurts. Quitting takes hard work and a lot of effort, but you can quit smoking. QUITTING SMOKING IS ONE OF THE MOST IMPORTANT THINGS YOU WILL EVER DO.  You will live longer, feel better, and live better.   The impact on your body of quitting smoking is felt almost immediately:   Within 20 minutes, blood pressure decreases. Pulse returns to its normal level.   After 8 hours, carbon monoxide levels in the blood return to normal. Oxygen level increases.   After 24 hours, chance of heart attack starts to decrease. Breath, hair, and body stop smelling like smoke.   After 48 hours, damaged nerve  endings begin to recover. Sense of taste and smell improve.   After 72  hours, the body is virtually free of nicotine. Bronchial tubes relax and breathing becomes easier.   After 2 to 12 weeks, lungs can hold more air. Exercise becomes easier and circulation improves.   Quitting will reduce your risk of having a heart attack, stroke, cancer, or lung disease:   After 1 year, the risk of coronary heart disease is cut in half.   After 5 years, the risk of stroke falls to the same as a nonsmoker.   After 10 years, the risk of lung cancer is cut in half and the risk of other cancers decreases significantly.   After 15 years, the risk of coronary heart disease drops, usually to the level of a nonsmoker.   If you are pregnant, quitting smoking will improve your chances of having a healthy baby.   The people you live with, especially your children, will be healthier.   You will have extra money to spend on things other than cigarettes.  FIVE KEYS TO QUITTING Studies have shown that these 5 steps will help you quit smoking and quit for good. You have the best chances of quitting if you use them together: 1. Get ready.  2. Get support and encouragement.  3. Learn new skills and behaviors.  4. Get medicine to reduce your nicotine addiction and use it correctly.  5. Be prepared for relapse or difficult situations. Be determined to continue trying to quit, even if you do not succeed at first.  1. GET READY  Set a quit date.   Change your environment.   Get rid of ALL cigarettes, ashtrays, matches, and lighters in your home, car, and place of work.   Do not let people smoke in your home.   Review your past attempts to quit. Think about what worked and what did not.   Once you quit, do not smoke. NOT EVEN A PUFF!  2. GET SUPPORT AND ENCOURAGEMENT Studies have shown that you have a better chance of being successful if you have help. You can get support in many ways.  Tell your family, friends, and coworkers that you are going to quit and need their support. Ask  them not to smoke around you.   Talk to your caregivers (doctor, dentist, nurse, pharmacist, psychologist, and/or smoking counselor).   Get individual, group, or telephone counseling and support. The more counseling you have, the better your chances are of quitting. Programs are available at [employer] and health centers. Call your local health department for information about programs in your area.   Spiritual beliefs and practices may help some smokers quit.   Quit meters are Photographer that keep track of quit statistics, such as amount of "quit-time," cigarettes not smoked, and money saved.   Many smokers find one or more of the many self-help books available useful in helping them quit and stay off tobacco.  3. LEARN NEW SKILLS AND BEHAVIORS  Try to distract yourself from urges to smoke. Talk to someone, go for a walk, or occupy your time with a task.   When you first try to quit, change your routine. Take a different route to work. Drink tea instead of coffee. Eat breakfast in a different place.   Do something to reduce your stress. Take a hot bath, exercise, or read a book.   Plan something enjoyable to do every day. Reward yourself for not smoking.  Explore interactive web-based programs that specialize in helping you quit.  4. GET MEDICINE AND USE IT CORRECTLY Medicines can help you stop smoking and decrease the urge to smoke. Combining medicine with the above behavioral methods and support can quadruple your chances of successfully quitting smoking. The [place] Food and Drug Administration (FDA) has approved 7 medicines to help you quit smoking. These medicines fall into 3 categories.  Nicotine replacement therapy (delivers nicotine to your body without the negative effects and risks of smoking):   Nicotine gum: Available over-the-counter.   Nicotine lozenges: Available over-the-counter.   Nicotine inhaler: Available by prescription.    Nicotine nasal spray: Available by prescription.   Nicotine skin patches (transdermal): Available by prescription and over-the-counter.   Antidepressant medicine (helps people abstain from smoking, but how this works is unknown):   Bupropion sustained-release (SR) tablets: Available by prescription.   Nicotinic receptor partial agonist (simulates the effect of nicotine in your brain):   Varenicline tartrate tablets: Available by prescription.   Ask your caregiver for advice about which medicines to use and how to use them. Carefully read the information on the package.   Everyone who is trying to quit may benefit from using a medicine. If you are pregnant or trying to become pregnant, nursing an infant, you are under age 41, or you smoke fewer than 10 cigarettes per day, talk to your caregiver before taking any nicotine replacement medicines.   You should stop using a nicotine replacement product and call your caregiver if you experience nausea, dizziness, weakness, vomiting, fast or irregular heartbeat, mouth problems with the lozenge or gum, or redness or swelling of the skin around the patch that does not go away.   Do not use any other product containing nicotine while using a nicotine replacement product.   Talk to your caregiver before using these products if you have diabetes, heart disease, asthma, stomach ulcers, you had a recent heart attack, you have high blood pressure that is not controlled with medicine, a history of irregular heartbeat, or you have been prescribed medicine to help you quit smoking.  5. BE PREPARED FOR RELAPSE OR DIFFICULT SITUATIONS  Most relapses occur within the first 3 months after quitting. Do not be discouraged if you start smoking again. Remember, most people try several times before they finally quit.   You may have symptoms of withdrawal because your body is used to nicotine. You may crave cigarettes, be irritable, feel very hungry, cough often,  get headaches, or have difficulty concentrating.   The withdrawal symptoms are only temporary. They are strongest when you first quit, but they will go away within 10 to 14 days.  Here are some difficult situations to watch for:  Alcohol. Avoid drinking alcohol. Drinking lowers your chances of successfully quitting.   Caffeine. Try to reduce the amount of caffeine you consume. It also lowers your chances of successfully quitting.   Other smokers. Being around smoking can make you want to smoke. Avoid smokers.   Weight gain. Many smokers will gain weight when they quit, usually less than 10 pounds. Eat a healthy diet and stay active. Do not let weight gain distract you from your main goal, quitting smoking. Some medicines that help you quit smoking may also help delay weight gain. You can always lose the weight gained after you quit.   Bad mood or depression. There are a lot of ways to improve your mood other than smoking.  If you are having problems with any  of these situations, talk to your caregiver. SPECIAL SITUATIONS AND CONDITIONS Studies suggest that everyone can quit smoking. Your situation or condition can give you a special reason to quit.  Pregnant women/new mothers: By quitting, you protect your baby's health and your own.   Hospitalized patients: By quitting, you reduce health problems and help healing.   Heart attack patients: By quitting, you reduce your risk of a second heart attack.   Lung, head, and neck cancer patients: By quitting, you reduce your chance of a second cancer.   Parents of children and adolescents: By quitting, you protect your children from illnesses caused by secondhand smoke.  QUESTIONS TO THINK ABOUT Think about the following questions before you try to stop smoking. You may want to talk about your answers with your caregiver.  Why do you want to quit?   If you tried to quit in the past, what helped and what did not?   What will be the most  difficult situations for you after you quit? How will you plan to handle them?   Who can help you through the tough times? Your family? Friends? Caregiver?   What pleasures do you get from smoking? What ways can you still get pleasure if you quit?  Here are some questions to ask your caregiver:  How can you help me to be successful at quitting?   What medicine do you think would be best for me and how should I take it?   What should I do if I need more help?   What is smoking withdrawal like? How can I get information on withdrawal?  Quitting takes hard work and a lot of effort, but you can quit smoking. FOR MORE INFORMATION  Smokefree.gov (http://www.davis-sullivan.com/) provides free, accurate, evidence-based information and professional assistance to help support the immediate and long-term needs of people trying to quit smoking. Document Released: 08/29/2001 Document Revised: 08/24/2011 Document Reviewed: 06/21/2009 [facility] Patient Information 2012 [place], [place].

## 2012-01-24 LAB — TESTOSTERONE, FREE, TOTAL, SHBG
Sex Hormone Binding: 25 nmol/L (ref 13–71)
Testosterone: 410.22 ng/dL (ref 300–890)

## 2012-01-25 MED ORDER — FEBUXOSTAT 80 MG PO TABS: 1.0000 | ORAL_TABLET | ORAL | Status: DC

## 2012-01-25 MED ORDER — COLCHICINE 0.6 MG PO TABS: 0.6000 mg | ORAL_TABLET | Freq: Every day | ORAL | Status: DC

## 2012-01-25 NOTE — Progress Notes (Signed)
Addended by: Hassan Rowan on: 01/25/2012 10:31 AM   Modules accepted: Orders

## 2012-03-19 ENCOUNTER — Other Ambulatory Visit: Payer: Self-pay | Admitting: *Deleted

## 2012-03-19 DIAGNOSIS — E790 Hyperuricemia without signs of inflammatory arthritis and tophaceous disease: Secondary | ICD-10-CM

## 2012-03-19 DIAGNOSIS — M109 Gout, unspecified: Secondary | ICD-10-CM

## 2012-03-19 MED ORDER — FEBUXOSTAT 80 MG PO TABS: 1.0000 | ORAL_TABLET | Freq: Every day | ORAL | Status: DC

## 2012-03-20 ENCOUNTER — Other Ambulatory Visit: Payer: Self-pay | Admitting: *Deleted

## 2012-03-20 DIAGNOSIS — E790 Hyperuricemia without signs of inflammatory arthritis and tophaceous disease: Secondary | ICD-10-CM

## 2012-03-20 DIAGNOSIS — M109 Gout, unspecified: Secondary | ICD-10-CM

## 2012-03-20 MED ORDER — FEBUXOSTAT 80 MG PO TABS: 1.0000 | ORAL_TABLET | Freq: Every day | ORAL | Status: DC

## 2012-06-20 ENCOUNTER — Ambulatory Visit (INDEPENDENT_AMBULATORY_CARE_PROVIDER_SITE_OTHER): Payer: BC Managed Care – PPO

## 2012-06-20 ENCOUNTER — Encounter: Payer: Self-pay | Admitting: Family Medicine

## 2012-06-20 ENCOUNTER — Ambulatory Visit (INDEPENDENT_AMBULATORY_CARE_PROVIDER_SITE_OTHER): Payer: BC Managed Care – PPO | Admitting: Family Medicine

## 2012-06-20 VITALS — BP 116/72 | HR 88 | Ht 72.0 in | Wt 262.0 lb

## 2012-06-20 DIAGNOSIS — M549 Dorsalgia, unspecified: Secondary | ICD-10-CM

## 2012-06-20 DIAGNOSIS — M533 Sacrococcygeal disorders, not elsewhere classified: Secondary | ICD-10-CM

## 2012-06-20 DIAGNOSIS — M5430 Sciatica, unspecified side: Secondary | ICD-10-CM

## 2012-06-20 DIAGNOSIS — M5431 Sciatica, right side: Secondary | ICD-10-CM

## 2012-06-20 MED ORDER — HYDROCODONE-ACETAMINOPHEN 5-500 MG PO TABS: 1.0000 | ORAL_TABLET | Freq: Four times a day (QID) | ORAL | Status: DC | PRN

## 2012-06-20 MED ORDER — MELOXICAM 15 MG PO TABS: 15.0000 mg | ORAL_TABLET | Freq: Every day | ORAL | Status: AC

## 2012-06-20 MED ORDER — ORPHENADRINE CITRATE ER 100 MG PO TB12: 100.0000 mg | ORAL_TABLET | Freq: Two times a day (BID) | ORAL | Status: DC

## 2012-06-20 NOTE — Patient Instructions (Signed)
Sciatica Sciatica is pain, weakness, numbness, or tingling along the path of the sciatic nerve. The nerve starts in the lower back and runs down the back of each leg. The nerve controls the muscles in the lower leg and in the back of the knee, while also providing sensation to the back of the thigh, lower leg, and the sole of your foot. Sciatica is a symptom of another medical condition. For instance, nerve damage or certain conditions, such as a herniated disk or bone spur on the spine, pinch or put pressure on the sciatic nerve. This causes the pain, weakness, or other sensations normally associated with sciatica. Generally, sciatica only affects one side of the body. CAUSES   Herniated or slipped disc.  Degenerative disk disease.  A pain disorder involving the narrow muscle in the buttocks (piriformis syndrome).  Pelvic injury or fracture.  Pregnancy.  Tumor (rare). SYMPTOMS  Symptoms can vary from mild to very severe. The symptoms usually travel from the low back to the buttocks and down the back of the leg. Symptoms can include:  Mild tingling or dull aches in the lower back, leg, or hip.  Numbness in the back of the calf or sole of the foot.  Burning sensations in the lower back, leg, or hip.  Sharp pains in the lower back, leg, or hip.  Leg weakness.  Severe back pain inhibiting movement. These symptoms may get worse with coughing, sneezing, laughing, or prolonged sitting or standing. Also, being overweight may worsen symptoms. DIAGNOSIS  Your caregiver will perform a physical exam to look for common symptoms of sciatica. He or she may ask you to do certain movements or activities that would trigger sciatic nerve pain. Other tests may be performed to find the cause of the sciatica. These may include:  Blood tests.  X-rays.  Imaging tests, such as an MRI or CT scan. TREATMENT  Treatment is directed at the cause of the sciatic pain. Sometimes, treatment is not necessary  and the pain and discomfort goes away on its own. If treatment is needed, your caregiver may suggest:  Over-the-counter medicines to relieve pain.  Prescription medicines, such as anti-inflammatory medicine, muscle relaxants, or narcotics.  Applying heat or ice to the painful area.  Steroid injections to lessen pain, irritation, and inflammation around the nerve.  Reducing activity during periods of pain.  Exercising and stretching to strengthen your abdomen and improve flexibility of your spine. Your caregiver may suggest losing weight if the extra weight makes the back pain worse.  Physical therapy.  Surgery to eliminate what is pressing or pinching the nerve, such as a bone spur or part of a herniated disk. HOME CARE INSTRUCTIONS   Only take over-the-counter or prescription medicines for pain or discomfort as directed by your caregiver.  Apply ice to the affected area for 20 minutes, 3 4 times a day for the first 48 72 hours. Then try heat in the same way.  Exercise, stretch, or perform your usual activities if these do not aggravate your pain.  Attend physical therapy sessions as directed by your caregiver.  Keep all follow-up appointments as directed by your caregiver.  Do not wear high heels or shoes that do not provide proper support.  Check your mattress to see if it is too soft. A firm mattress may lessen your pain and discomfort. SEEK IMMEDIATE MEDICAL CARE IF:   You lose control of your bowel or bladder (incontinence).  You have increasing weakness in the lower back,   pelvis, buttocks, or legs.  You have redness or swelling of your back.  You have a burning sensation when you urinate.  You have pain that gets worse when you lie down or awakens you at night.  Your pain is worse than you have experienced in the past.  Your pain is lasting longer than 4 weeks.  You are suddenly losing weight without reason. MAKE SURE YOU:  Understand these  instructions.  Will watch your condition.  Will get help right away if you are not doing well or get worse. Document Released: 08/29/2001 Document Revised: 03/05/2012 Document Reviewed: 01/14/2012 [employer] Patient Information 2013 [employer].  

## 2012-06-20 NOTE — Progress Notes (Signed)
  Subjective:    Patient ID: Phillip Wyatt, male    DOB: 11/21/1960, 51 y.o.   MRN: [id number]  Leg Pain  The incident occurred 5 to 7 days ago. 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 The injury mechanism was a twisting injury. The pain is present in the right thigh. The quality of the pain is described as aching, shooting and stabbing. The pain is at a severity of 4/10 (Pain was described as being between 4-10 out of 10 pain.). The pain is moderate. The pain has been fluctuating since onset. Pertinent negatives include no inability to bear weight, loss of motion, loss of sensation, muscle weakness, numbness or tingling. He reports no foreign bodies present. The symptoms are aggravated by movement and weight bearing. He has tried ice, elevation, NSAIDs and rest for the symptoms. The treatment provided no relief.      Review of Systems  Musculoskeletal: Positive for myalgias, joint swelling and arthralgias.  Neurological: Negative for tingling and numbness.  All other systems reviewed and are negative.        BP 116/72  Pulse 88  Ht 6' (1.829 m)  Wt 262 lb (118.842 kg)  BMI 35.53 kg/m2  SpO2 98% Objective:   Physical Exam  Constitutional: He is oriented to person, place, and time. He appears well-developed.  HENT:  Head: Normocephalic.  Musculoskeletal: He exhibits tenderness. He exhibits no edema.       Patient doesn't have any pinpoint tenderness in the right inguinal area. There is no lumbar tenderness either. Most of the tenderness is over the right iliac area. Muscles over that area are in spasm and tenderness to palpation. This injury is consistent with a sciatic irritation and inflammation due to muscle spasms   Neurological: He is alert and oriented to person, place, and time.  Skin: Skin is warm and dry. No erythema.    Psychiatric: He has a normal mood and affect. His behavior is normal.      Assessment & Plan:  #1 sciatica/sacroiliitis. Patient will have his lumbar spine x-rayed to make sure no damage to his back is been done. Placed on Vicodin 5/500 #20 one tablet every 8 hours to use when necessary basis for pain. 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  Norflex 100 mg one tablet twice a day for muscle spasm and Mobic 15 mg one tablet daily do not take with Motrin. Return Tuesday for followup unless it is better and then he can cancel that appointment. He has been off work but disposed to go back to work on Tuesday.       #2 immunization update offered flu vaccination but he declines flu vaccination at this time.

## 2012-06-25 ENCOUNTER — Ambulatory Visit (INDEPENDENT_AMBULATORY_CARE_PROVIDER_SITE_OTHER): Payer: BC Managed Care – PPO | Admitting: Family Medicine

## 2012-06-25 ENCOUNTER — Encounter: Payer: Self-pay | Admitting: Family Medicine

## 2012-06-25 VITALS — BP 126/78 | HR 101 | Ht 72.0 in | Wt 259.0 lb

## 2012-06-25 DIAGNOSIS — M533 Sacrococcygeal disorders, not elsewhere classified: Secondary | ICD-10-CM

## 2012-06-25 DIAGNOSIS — Z23 Encounter for immunization: Secondary | ICD-10-CM

## 2012-06-25 DIAGNOSIS — Q7649 Other congenital malformations of spine, not associated with scoliosis: Secondary | ICD-10-CM

## 2012-06-25 MED ORDER — OXYCODONE-ASPIRIN 4.5-0.38-325 MG PO TABS: 1.0000 | ORAL_TABLET | Freq: Four times a day (QID) | ORAL | Status: DC | PRN

## 2012-06-25 NOTE — Progress Notes (Signed)
  Subjective:    Patient ID: Phillip Wyatt, male    DOB: 1961/04/10, 51 y.o.   MRN: [id number]  HPI Patient reports still having significant amount of pain on his right side. He reports pain still going down the right leg. He states he has had to double up on his Vicodin tablets to control the pain. He has been using vacation time to stay out but tomorrow is his last day of vacation.   #2 immunization update he needs a flu vaccination.    Review of Systems  All other systems reviewed and are negative.      BP 126/78  Pulse 101  Ht 6' (1.829 m)  Wt 259 lb (117.482 kg)  BMI 35.13 kg/m2  SpO2 96% Objective:   Physical Exam  Vitals reviewed. Constitutional: He is oriented to person, place, and time. He appears well-developed and well-nourished.       Obese black male.  HENT:  Head: Normocephalic.  Neck: Neck supple.  Musculoskeletal: He exhibits tenderness. He exhibits no edema.       He has pinpoint tenderness over his right iliac sacral joint space consistent with sacral ileitis.  Neurological: He is alert and oriented to person, place, and time.  Skin: Skin is warm and dry.  Psychiatric: He has a normal mood and affect.      Assessment & Plan:  #1 sacroiliitis.  Offered to do a blind injection of the right ilium sacral joint which he was not to eager to have done. Explained to the patient last week the area that was tender was quite extensive but with conservative treatment we now have localized to the iliosacral joint area. Will make a referral to Dr. Karie Schwalbe. for evaluation and possible injection of the left iliac sacral joint area. Will have him return next Tuesday for reevaluation however if he is feeling better he may cancel that appointment and go back to work on Tuesday.  Will change Vicodin to Percocet one by mouth every 6 hours when necessary for pain.  #2 flu vaccination will minister flu vaccination today as well.

## 2012-06-25 NOTE — Patient Instructions (Addendum)
  Patient may return to work next week Tuesday the 15th if he is feeling better and may cancel that appointment he'll have with me on the 15th.     Sacroiliac Joint Dysfunction The sacroiliac joint connects the lower part of the spine (the sacrum) with the bones of the pelvis. CAUSES  Sometimes, there is no obvious reason for sacroiliac joint dysfunction. Other times, it may occur   During pregnancy.  After injury, such as:  Car accidents.  Sport-related injuries.  Work-related injuries.  Due to one leg being shorter than the other.  Due to other conditions that affect the joints, such as:  Rheumatoid arthritis.  Gout.  Psoriasis.  Joint infection (septic arthritis). SYMPTOMS  Symptoms may include:  Pain in the:  Lower back.  Buttocks.  Groin.  Thighs and legs.  Difficult sitting, standing, walking, lying, bending or lifting. DIAGNOSIS  A number of tests may be used to help diagnose the cause of sacroiliac joint dysfunction, including:  Imaging tests to look for other causes of pain, including:  MRI.  CT scan.  Bone scan.  Diagnostic injection: During a special x-ray (called fluoroscopy), a needle is put into the sacroiliac joint. A numbing medicine is injected into the joint. If the pain is improved or stopped, the diagnosis of sacroiliac joint dysfunction is more likely. TREATMENT  There are a number of types of treatment used for sacroiliac joint dysfunction, including:  Only take over-the-counter or prescription medicines for pain, discomfort, or fever as directed by your caregiver.  Medications to relax muscles.  Rest. Decreasing activity can help cut down on painful muscle spasms and allow the back to heal.  Application of heat or ice to the lower back may improve muscle spasms and soothe pain.  Brace. A special back brace, called a sacroiliac belt, can help support the joint while your back is healing.  Physical therapy can help teach  comfortable positions and exercises to strengthen muscles that support the sacroiliac joint.  Cortisone injections. Injections of steroid medicine into the joint can help decrease swelling and improve pain.  Hyaluronic acid injections. This chemical improves lubrication within the sacroiliac joint, thereby decreasing pain.  Radiofrequency ablation. A special needle is placed into the joint, where it burns away nerves that are carrying pain messages from the joint.  Surgery. Because pain occurs during movement of the joint, screws and plates may be installed in order to limit or prevent joint motion. HOME CARE INSTRUCTIONS   Take all medications exactly as directed.  Follow instructions regarding both rest and physical activity, to avoid worsening the pain.  Do physical therapy exercises exactly as prescribed. SEEK IMMEDIATE MEDICAL CARE IF:  You experience increasingly severe pain.  You develop new symptoms, such as numbness or tingling in your legs or feet.  You lose bladder or bowel control. Document Released: 12/01/2008 Document Revised: 11/27/2011 Document Reviewed: 12/01/2008 [facility] Patient Information 2013 [place], [place].

## 2012-06-26 ENCOUNTER — Telehealth: Payer: Self-pay | Admitting: *Deleted

## 2012-06-26 NOTE — Telephone Encounter (Signed)
Pt is unable to find a pharm that has percodan. Pt would like Rx changed. Please advise.

## 2012-06-27 MED ORDER — OXYCODONE-ACETAMINOPHEN 5-325 MG PO TABS: 1.0000 | ORAL_TABLET | Freq: Four times a day (QID) | ORAL | Status: DC | PRN

## 2012-06-27 NOTE — Telephone Encounter (Signed)
It should have been for Percocet. We'll be glad to give him a corrected prescription. Thank you.

## 2012-11-07 ENCOUNTER — Telehealth: Payer: Self-pay | Admitting: Family Medicine

## 2012-11-07 MED ORDER — HYDROCODONE-ACETAMINOPHEN 5-325 MG PO TABS: ORAL_TABLET | ORAL | Status: DC

## 2012-11-07 NOTE — Telephone Encounter (Signed)
(  Appears patient uses this sparingly, last fill in October)  Sue Lush, Rx placed in your inbox attached to rx [facility] from [facility], will you please [facility]?

## 2012-11-08 NOTE — Telephone Encounter (Signed)
This was faxed yesterday

## 2013-03-20 ENCOUNTER — Ambulatory Visit (INDEPENDENT_AMBULATORY_CARE_PROVIDER_SITE_OTHER): Payer: BC Managed Care – PPO | Admitting: Family Medicine

## 2013-03-20 ENCOUNTER — Encounter: Payer: Self-pay | Admitting: Family Medicine

## 2013-03-20 VITALS — BP 134/77 | HR 101 | Wt 267.0 lb

## 2013-03-20 DIAGNOSIS — I10 Essential (primary) hypertension: Secondary | ICD-10-CM

## 2013-03-20 DIAGNOSIS — M109 Gout, unspecified: Secondary | ICD-10-CM

## 2013-03-20 DIAGNOSIS — M25572 Pain in left ankle and joints of left foot: Secondary | ICD-10-CM

## 2013-03-20 DIAGNOSIS — M25579 Pain in unspecified ankle and joints of unspecified foot: Secondary | ICD-10-CM

## 2013-03-20 DIAGNOSIS — E785 Hyperlipidemia, unspecified: Secondary | ICD-10-CM

## 2013-03-20 HISTORY — DX: Gout, unspecified: M10.9

## 2013-03-20 MED ORDER — OLMESARTAN-AMLODIPINE-HCTZ 20-5-12.5 MG PO TABS: 1.0000 | ORAL_TABLET | Freq: Every day | ORAL | Status: DC

## 2013-03-20 MED ORDER — DICLOFENAC SODIUM 1 % TD GEL: TRANSDERMAL | Status: DC

## 2013-03-20 NOTE — Progress Notes (Signed)
CC: Phillip Wyatt is a 52 y.o. male is here for Hypertension   Subjective: HPI:  Pleasant former patient Dr. Thurmond Wyatt  Patient presents for hypertension followup: No outside blood pressures to report. He has been takingtribenzor on a daily basis without missed doses. Nothing seems to make blood pressure better or worse other than great improvement since starting antihypertensives. He denies headaches, motor or sensory disturbances, chest pain, shortness of breath, orthopnea, peripheral edema  Followup hyperlipidemia: Last cholesterol panel one year ago LDL 108 he has been focusing on diet and exercise when he can. Has not required cholesterol medication  Patient complains of left foot swelling and pain that is not presently bothering him. It occurs 2 times a year. They will occur without warning and last 2-4 weeks. 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 Nothing else makes better or worse. It improves with rest and ibuprofen. He fractured his fibula decades ago. No other recent trauma or overexertion. He current eye swelling, pain, skin changes   Review Of Systems Outlined In HPI  Past Medical History  Diagnosis Date  . Gout 03/20/2013  . HYPERTENSION, BENIGN ESSENTIAL 08/30/2006    Qualifier: Diagnosis of  By: Thomos Lemons       History reviewed. No pertinent family history.   History  Substance Use Topics  . Smoking status: Current Every Day Smoker -- 1.00 packs/day    Types: Cigarettes  . Smokeless tobacco: Not on file  . Alcohol Use: Not on file     Objective: Filed Vitals:   03/20/13 0935  BP: 134/77  Pulse: 101    General: Alert and Oriented, No Acute Distress HEENT: Pupils equal, round, reactive to light. Conjunctivae clear.  Moist mucous membranes pharynx unremarkable Lungs: Clear to auscultation bilaterally, no wheezing/ronchi/rales.  Comfortable work of breathing. Good air movement. Cardiac: Regular rate and rhythm. Normal S1/S2.  No murmurs, rubs,  nor gallops.   Abdomen: Obese soft nontender Extremities: No peripheral edema.  Strong peripheral pulses. Left foot: Anterior drawer negative, full range of motion and strength, no pain on lateral and medial malleoli, 1+ dorsalis pedis. No swelling or redness nor warmth Mental Status: No depression, anxiety, nor agitation. Skin: Warm and dry.  Assessment & Plan: Phillip Wyatt was seen today for hypertension.  Diagnoses and associated orders for this visit:  HYPERTENSION, BENIGN ESSENTIAL - BASIC METABOLIC PANEL WITH GFR  Hyperlipidemia - Lipid panel  Hypertension - Olmesartan-Amlodipine-HCTZ (TRIBENZOR) 20-5-12.5 MG TABS; Take 1 tablet by mouth daily.  Pain in joint, ankle and foot, left - diclofenac sodium (VOLTAREN) 1 % GEL; Finger length tid prn ankle pain  Gout    Essential hypertension: Controlled chronic condition continue tribenzor checking renal function Hyperlipidemia: Due for LDL checking Monday Left joint pain: Suspect this is due to arthritis given remote trauma I asked him to return when pain returns for consideration of steroid injection and imaging for further characterization. When pain begins to start diclofenac gel   Return in about 3 months (around 06/20/2013).

## 2013-12-11 ENCOUNTER — Ambulatory Visit (INDEPENDENT_AMBULATORY_CARE_PROVIDER_SITE_OTHER): Payer: BC Managed Care – PPO | Admitting: Family Medicine

## 2013-12-11 ENCOUNTER — Encounter: Payer: Self-pay | Admitting: Family Medicine

## 2013-12-11 VITALS — BP 140/87 | HR 100 | Wt 273.0 lb

## 2013-12-11 DIAGNOSIS — I10 Essential (primary) hypertension: Secondary | ICD-10-CM

## 2013-12-11 DIAGNOSIS — M25561 Pain in right knee: Secondary | ICD-10-CM

## 2013-12-11 DIAGNOSIS — M25569 Pain in unspecified knee: Secondary | ICD-10-CM

## 2013-12-11 DIAGNOSIS — Z13220 Encounter for screening for lipoid disorders: Secondary | ICD-10-CM

## 2013-12-11 DIAGNOSIS — M109 Gout, unspecified: Secondary | ICD-10-CM

## 2013-12-11 LAB — LIPID PANEL
CHOL/HDL RATIO: 3.5 ratio
Cholesterol: 140 mg/dL (ref 0–200)
HDL: 40 mg/dL (ref >39)
LDL Cholesterol: 82 mg/dL (ref 0–99)
Triglycerides: 89 mg/dL (ref <150)
VLDL: 18 mg/dL (ref 0–40)

## 2013-12-11 LAB — BASIC METABOLIC PANEL WITH GFR
BUN: 12 mg/dL (ref 6–23)
CHLORIDE: 104 meq/L (ref 96–112)
CO2: 28 meq/L (ref 19–32)
Calcium: 9 mg/dL (ref 8.4–10.5)
Creat: 1.15 mg/dL (ref 0.50–1.35)
GFR, EST NON AFRICAN AMERICAN: 72 mL/min
GFR, Est African American: 83 mL/min
GLUCOSE: 84 mg/dL (ref 70–99)
POTASSIUM: 4.5 meq/L (ref 3.5–5.3)
Sodium: 139 mEq/L (ref 135–145)

## 2013-12-11 LAB — URIC ACID: Uric Acid, Serum: 8 mg/dL — ABNORMAL HIGH (ref 4.0–7.8)

## 2013-12-11 NOTE — Progress Notes (Signed)
CC: Phillip Wyatt is a 53 y.o. male is here for No chief complaint on file.   Subjective: HPI:  Complains of right knee pain that has been present for last 1-1/2 weeks. Came on without any inciting events somewhat acutely has not been getting better or worse since onset. Accompanied with moderate swelling that worsens throughout the day. Denies redness, warmth, locking, catching, nor giving way. Pain is localized within the joint and radiates into the back of the joint, worse with full knee extension or full knee flexion. Additional pain on the lateral surface of the knee. Pain overall as are described as pain and moderate in severity. Greatly improved with ibuprofen taking up to 8 doses a day. Denies fevers, chills, flushing, bruising, nor joint pain elsewhere.   has not taken blood pressure medication the last 2 days. Denies intolerance to medication. No outside blood pressures to report. Denies headaches, motor sensory disturbances, chest pain shortness of breath nor peripheral edema.   Review Of and Past Medical History  Diagnosis Date  . Gout 03/20/2013  . HYPERTENSION, BENIGN ESSENTIAL 08/30/2006    Qualifier: Diagnosis of  By: Cathey EndowBowen DO, Karen      No past surgical history on file. No family history on file.  History   Social History  . Marital Status: Single    Spouse Name: N/A    Number of Children: N/A  . Years of Education: N/A   Occupational History  . Not on file.   Social History Main Topics  . Smoking status: Current Every Day Smoker -- 1.00 packs/day    Types: Cigarettes  . Smokeless tobacco: Not on file  . Alcohol Use: Not on file  . Drug Use: Not on file  . Sexual Activity: Not on file   Other Topics Concern  . Not on file   Social History Narrative  . No narrative on file     Objective: There were no vitals taken for this visit.    General: Alert and Oriented, No Acute Distress HEENT: Pupils equal, round, reactive to light. Conjunctivae clear.   Moist mucous membranes pharynx unremarkable Lungs: Clear to auscultation bilaterally, no wheezing/ronchi/rales.  Comfortable work of breathing. Good air movement. Cardiac: Regular rate and rhythm. Normal S1/S2.  No murmurs, rubs, nor gallops.   Abdomen: Normal bowel sounds, soft and non tender without palpable masses. Right knee exam shows full-strength and range of motion. There is mild effusion. There is no redness, nor warmth overlying the knee.  No patellar crepitus. No patellar apprehension. No pain with palpation of the inferior patellar pole.  No pain or laxity with valgus stress. Mild pain with varus stress but no laxity.Marland Kitchen. Anterior drawer is negative. McMurray's negative. Mild popliteal space tenderness but no palpable mass. No medial or lateral joint line tenderness to palpation. No joint line tenderness. Mental Status: No depression, anxiety, nor agitation. Skin: Warm and dry.  Assessment & Plan: Phillip Wyatt was seen today for no specified reason.  Diagnoses and associated orders for this visit:  HYPERTENSION, BENIGN ESSENTIAL - BASIC METABOLIC PANEL WITH GFR  Gout - Uric acid  Lipid screening - Lipid Profile  Right knee pain    Gout: Overdue for uric acid he is not taking any uric acid lowering agents, goal less than 6 Due for annual dyslipidemia screen Essential hypertension: Uncontrolled chronic condition this is likely due to at least partially skipping blood pressure medication doses, he denies being out of medication I've encouraged him to take it on a daily  basis we can recheck this in 3-4 weeks Right knee pain: Suspect a combination of osteoarthritis causing joint effusion and lateral collateral ligament strain, for the latter of the 2 I provided him with home rehabilitation plan to begin a daily basis the next 3 weeks. He was provided with an Ace bandage to compress the knee for the next week as needed. He agrees to steroid injection today.  Return in about 4 weeks  (around 01/08/2014).  Knee Arthrocentesis with Injection Procedure Note  Pre-operative Diagnosis: right OA  Post-operative Diagnosis: same  Indications: Symptomatic relief of large effusion and Symptom relief from osteoarthritis  Anesthesia: topical cold spray   Procedure Details   Verbal consent was obtained for the procedure. The joint was prepped with Betadine and cold spray was applied. A 22 gauge needle was inserted into the inferior medial approach. 0 ml of synovial fluid was removed from the joint and discarded. 2 ml 1% lidocaine and 2 ml of triamcinolone (KENALOG) 40mg /ml was then injected into the joint through the same needle. The needle was removed and the area cleansed and dressed.  Complications:  None; patient tolerated the procedure well.

## 2013-12-12 ENCOUNTER — Telehealth: Payer: Self-pay | Admitting: *Deleted

## 2013-12-12 ENCOUNTER — Telehealth: Payer: Self-pay | Admitting: Family Medicine

## 2013-12-12 DIAGNOSIS — M109 Gout, unspecified: Secondary | ICD-10-CM

## 2013-12-12 DIAGNOSIS — E790 Hyperuricemia without signs of inflammatory arthritis and tophaceous disease: Secondary | ICD-10-CM

## 2013-12-12 MED ORDER — COLCHICINE 0.6 MG PO TABS: 0.6000 mg | ORAL_TABLET | Freq: Every day | ORAL | Status: DC

## 2013-12-12 MED ORDER — FEBUXOSTAT 80 MG PO TABS: 1.0000 | ORAL_TABLET | Freq: Every day | ORAL | Status: DC

## 2013-12-12 NOTE — Telephone Encounter (Signed)
[facility]

## 2013-12-12 NOTE — Telephone Encounter (Signed)
Pt states the Ibuprofen is not helping his knee pain. He would like medication for it. Ok to send colchicine?

## 2013-12-12 NOTE — Telephone Encounter (Signed)
Pt req

## 2014-05-07 ENCOUNTER — Other Ambulatory Visit: Payer: Self-pay | Admitting: Family Medicine

## 2014-08-04 ENCOUNTER — Encounter: Payer: Self-pay | Admitting: Family Medicine

## 2014-08-04 ENCOUNTER — Ambulatory Visit (INDEPENDENT_AMBULATORY_CARE_PROVIDER_SITE_OTHER): Payer: BC Managed Care – PPO | Admitting: Family Medicine

## 2014-08-04 VITALS — BP 143/89 | HR 80 | Ht 71.0 in | Wt 276.0 lb

## 2014-08-04 DIAGNOSIS — M545 Low back pain, unspecified: Secondary | ICD-10-CM

## 2014-08-04 DIAGNOSIS — M1A9XX0 Chronic gout, unspecified, without tophus (tophi): Secondary | ICD-10-CM

## 2014-08-04 DIAGNOSIS — I10 Essential (primary) hypertension: Secondary | ICD-10-CM | POA: Diagnosis not present

## 2014-08-04 MED ORDER — MELOXICAM 15 MG PO TABS: 15.0000 mg | ORAL_TABLET | Freq: Every day | ORAL | Status: DC | PRN

## 2014-08-04 MED ORDER — OLMESARTAN-AMLODIPINE-HCTZ 20-5-12.5 MG PO TABS: 1.0000 | ORAL_TABLET | Freq: Every day | ORAL | Status: DC

## 2014-08-04 NOTE — Progress Notes (Signed)
CC: Phillip Wyatt is a 53 y.o. male is here for Follow-up   Subjective: HPI:   follow-up hypertension: He ran out of his blood pressure medication a little over a month ago. No outside blood pressures report. Denies any intolerance or side effects on the medication. Denies chest pain shortness of breath orthopnea peripheral edema no motor or sensory disturbances  Follow-up gout: He stopped taking Uloric greater than 3 months ago due to the price. Since I saw him last he denies any joint pain swelling or redness.  He has not been taking any anti-inflammatories.  His knee pain resolved within a day after a steroid injection last time I saw him.  Complains of bilateral back pain in the lower back and thoracic region that is present most days of the week. 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 It is described as a tightness nothing particularly makes it better or worse other than above. it is nonradiating. Denies any recent or remote trauma.   Review Of Systems Outlined In HPI  Past Medical History  Diagnosis Date  . Gout 03/20/2013  . HYPERTENSION, BENIGN ESSENTIAL 08/30/2006    Qualifier: Diagnosis of  By: Cathey EndowBowen DO, Karen      No past surgical history on file. No family history on file.  History   Social History  . Marital Status: Single    Spouse Name: N/A    Number of Children: N/A  . Years of Education: N/A   Occupational History  . Not on file.   Social History Main Topics  . Smoking status: Current Every Day Smoker -- 1.00 packs/day    Types: Cigarettes  . Smokeless tobacco: Not on file  . Alcohol Use: Not on file  . Drug Use: Not on file  . Sexual Activity: Not on file   Other Topics Concern  . Not on file   Social History Narrative     Objective: BP 143/89 mmHg  Pulse 80  Ht 5\' 11"  (1.803 m)  Wt 276 lb (125.193 kg)  BMI 38.51 kg/m2  General: Alert and Oriented, No Acute Distress HEENT: Pupils equal, round, reactive to light.  Conjunctivae clear.  moistMucous membranes pharynxunremarkable Lungs: Clear to auscultation bilaterally, no wheezing/ronchi/rales.  Comfortable work of breathing. Good air movement. Cardiac: Regular rate and rhythm. Normal S1/S2.  No murmurs, rubs, nor gallops.   Back: No midline spinous process tenderness in the lumbar or thoracic spine. He ha smoderately tight paraspinal musculature in the lumbar and thoracic region. Extremities: No peripheral edema.  Strong peripheral pulses.  Mental Status: No depression, anxiety, nor agitation. Skin: Warm and dry.  Assessment & Plan: Phillip Wyatt was seen today for follow-up.  Diagnoses and associated orders for this visit:  Chronic gout without tophus, unspecified cause, unspecified site  HYPERTENSION, BENIGN ESSENTIAL - Olmesartan-Amlodipine-HCTZ (TRIBENZOR) 20-5-12.5 MG TABS; Take 1 tablet by mouth daily.  Bilateral low back pain without sciatica - meloxicam (MOBIC) 15 MG tablet; Take 1 tablet (15 mg total) by mouth daily as needed (back pain).    gout: Controlled, I suspect that his knee pain when I saw him last was actually osteoarthritis, given his lack of flares we will not use any prophylaxis at this time. Hypertension: Uncontrolled chronic condition restart tribenzor Back pain: I believe this is entirely from a musculature etiology. 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

## 2014-09-14 ENCOUNTER — Other Ambulatory Visit: Payer: Self-pay

## 2014-09-14 DIAGNOSIS — I10 Essential (primary) hypertension: Secondary | ICD-10-CM

## 2014-09-14 MED ORDER — OLMESARTAN-AMLODIPINE-HCTZ 20-5-12.5 MG PO TABS: 1.0000 | ORAL_TABLET | Freq: Every day | ORAL | Status: DC

## 2015-01-25 ENCOUNTER — Encounter: Payer: Self-pay | Admitting: Physician Assistant

## 2015-01-25 ENCOUNTER — Ambulatory Visit (INDEPENDENT_AMBULATORY_CARE_PROVIDER_SITE_OTHER): Payer: BLUE CROSS/BLUE SHIELD

## 2015-01-25 ENCOUNTER — Ambulatory Visit (INDEPENDENT_AMBULATORY_CARE_PROVIDER_SITE_OTHER): Payer: BLUE CROSS/BLUE SHIELD | Admitting: Physician Assistant

## 2015-01-25 VITALS — BP 129/76 | HR 104 | Ht 71.0 in | Wt 271.0 lb

## 2015-01-25 DIAGNOSIS — M25461 Effusion, right knee: Secondary | ICD-10-CM

## 2015-01-25 DIAGNOSIS — M1711 Unilateral primary osteoarthritis, right knee: Secondary | ICD-10-CM | POA: Diagnosis not present

## 2015-01-25 DIAGNOSIS — M179 Osteoarthritis of knee, unspecified: Secondary | ICD-10-CM

## 2015-01-25 DIAGNOSIS — M1990 Unspecified osteoarthritis, unspecified site: Secondary | ICD-10-CM | POA: Insufficient documentation

## 2015-01-25 DIAGNOSIS — M1000 Idiopathic gout, unspecified site: Secondary | ICD-10-CM | POA: Diagnosis not present

## 2015-01-25 DIAGNOSIS — M25561 Pain in right knee: Secondary | ICD-10-CM

## 2015-01-25 LAB — URIC ACID: URIC ACID, SERUM: 7.1 mg/dL (ref 4.0–7.8)

## 2015-01-25 MED ORDER — HYDROCODONE-ACETAMINOPHEN 5-325 MG PO TABS: 1.0000 | ORAL_TABLET | Freq: Three times a day (TID) | ORAL | Status: DC | PRN

## 2015-01-25 MED ORDER — MELOXICAM 15 MG PO TABS: 15.0000 mg | ORAL_TABLET | Freq: Every day | ORAL | Status: DC

## 2015-01-25 NOTE — Patient Instructions (Signed)
Medial Collateral Knee Ligament Sprain  with Phase I Rehab The medial collateral ligament (MCL) of the knee helps hold the knee joint in proper alignment and prevents the bones from shifting out of alignment (displacing) to the inside (medially). Injury to the knee may cause a tear in the MCL ligament (sprain). Sprains may heal without treatment, but this often results in a loose joint. Sprains are classified into three categories. Grade 1 sprains cause pain, but the tendon is not lengthened. Grade 2 sprains include a lengthened ligament, due to the ligament being stretched or partially ruptured. With grade 2 sprains, there is still function, although possibly decreased. Grade 3 sprains involve a complete tear of the tendon or muscle, and function is usually impaired. SYMPTOMS   Pain and tenderness on the inner side of the knee.  A "pop," tearing or pulling sensation at the time of injury.  Bruising (contusion) at the site of injury, within 48 hours of injury.  Knee stiffness.  Limping, often walking with the knee bent. CAUSES  An MCL sprain occurs when a force is placed on the ligament that is greater than it can handle. Common mechanisms of injury include:  Direct hit (trauma) to the outer side of the knee, especially if the foot is planted on the ground.  Forceful pivoting of the body and leg, while the foot is planted on the ground. RISK INCREASES WITH:  Contact sports (football, rugby).  Sports that require pivoting or cutting (soccer).  Poor knee strength and flexibility.  Improper equipment use. PREVENTION  Warm up and stretch properly before activity.  Maintain physical fitness:  Strength, flexibility and endurance.  Cardiovascular fitness.  Wear properly fitted protective equipment (correct length of cleats for surface).  Functional braces may be effective in preventing injury. PROGNOSIS  MCL tears usually heal without the need for surgery. Sometimes however,  surgery is required. RELATED COMPLICATIONS  Frequently recurring symptoms, such as the knee giving way, knee instability or knee swelling.  Injury to other structures in the knee joint:  Meniscal cartilage, resulting in locking and swelling of the knee.  Articular cartilage, resulting in knee arthritis.  Other ligaments of the knee.  Injury to nerves, resulting in numbness of the outer leg, foot or ankle and weakness or paralysis, with inability to raise the ankle or toes.  Knee stiffness. TREATMENT Treatment first involves the use of ice and medicine, to reduce pain and inflammation. The use of strengthening and stretching exercises may help reduce pain with activity. These exercises may be performed at home, but referral to a therapist is often advised. You may be advised to walk with crutches until you are able to walk without a limp. Your caregiver may provide you with a hinged knee brace to help regain a full range of motion, while also protecting the injured knee. For severe MCL injuries or injuries that involve other ligaments of the knee, surgery is often advised. MEDICATION  Do not take pain medicine for 7 days before surgery.  Only use over-the-counter pain medicine as directed by your caregiver.  Only use prescription pain relievers as directed and only in needed amounts. HEAT AND COLD  Cold treatment (icing) should be applied for 10 to 15 minutes every 2 to 3 hours for inflammation and pain, and immediately after any activity, that aggravates the symptoms. Use ice packs or an ice massage.  Heat treatment may be used before performing stretching and strengthening activities prescribed by your caregiver, physical therapist or athletic trainer.   Use a heat pack or warm water soak. SEEK MEDICAL CARE IF:   Symptoms get worse or do not improve in 4 to 6 weeks, despite treatment.  New, unexplained symptoms develop. EXERCISES  PHASE I EXERCISES  RANGE OF MOTION (ROM) AND  STRETCHING EXERCISES-Medial Collateral Knee Ligament Sprain Phase I These are some of the initial exercises that your physician, physical therapist or athletic trainer may have you perform to begin your rehabilitation. When you demonstrate gains in your flexibility and strength, your caregiver may progress you to Phase II exercises. As you perform these exercises, remember:  These initial exercises are intended to be gentle. They will help you restore motion without increasing any swelling.  Completing these exercises allows less painful movement and prepares you for the more aggressive strengthening exercises in Phase II.  An effective stretch should be held for at least 30 seconds.  A stretch should never be painful. You should only feel a gentle lengthening or release in the stretched tissue. RANGE OF MOTION-Knee Flexion, Active  Lie on your back with both knees straight. (If this causes back discomfort, bend your healthy knee, placing your foot flat on the floor.)  Slowly slide your heel back toward your buttocks until you feel a gentle stretch in the front of your knee or thigh.  Hold for __________ seconds. Slowly slide your heel back to the starting position. Repeat __________ times. Complete this exercise __________ times per day. STRETCH-Knee Flexion, Supine  Lie on the floor with your right / left heel and foot lightly touching the wall. (Place both feet on the wall if you do not use a door frame.)  Without using any effort, allow gravity to slide your foot down the wall slowly until you feel a gentle stretch in the front of your right / left knee.  Hold this stretch for __________ seconds. Then return the leg to the starting position, using your health leg for help, if needed. Repeat __________ times. Complete this stretch __________ times per day. RANGE OF MOTION-Knee Flexion and Extension, Active-Assisted  Sit on the edge of a table or chair with your thighs firmly supported.  It may be helpful to place a folded towel under the end of your right / left thigh.  Flexion (bending): Place the ankle of your healthy leg on top of the other ankle. Use your healthy leg to gently bend your right / left knee until you feel a mild tension across the top of your knee.  Hold for __________ seconds.  Extension (straightening): Switch your ankles so your right / left leg is on top. Use your healthy leg to straighten your right / left knee until you feel a mild tension on the backside of your knee.  Hold for __________ seconds. Repeat __________ times. Complete this exercise __________ times per day. STRETCH-Knee Extension Sitting  Sit with your right / left leg/heel propped on another chair, coffee table, or foot stool.  Allow your leg muscles to relax, letting gravity straighten out your knee.*  You should feel a stretch behind your right / left knee. Hold this position for __________ seconds. Repeat __________ times. Complete this stretch __________ times per day. *Your physician, physical therapist or athletic trainer may instruct you to place a __________ weight on your thigh, just above your kneecap, to deepen the stretch. STRENGTHENING EXERCISES-Medial Collateral Knee Ligament Sprain Phase I These exercises may help you when beginning to rehabilitate your injury. They may resolve your symptoms with or without further involvement   from your physician, physical therapist or athletic trainer. While completing these exercises, remember:   In order to return to more demanding activities, you will likely need to progress to more challenging exercises. Your physician, physical therapist or athletic trainer will advance your exercises when your tissues show adequate healing and your muscles demonstrate increased strength.  Muscles can gain both the endurance and the strength needed for everyday activities through controlled exercises.  Complete these exercises as instructed by  your physician, physical therapist or athletic trainer. Increase the resistance and repetitions only as guided by your caregiver. STRENGTH-Quadriceps, Isometrics  Lie on your back with your right / left leg extended and your opposite knee bent.  Gradually tense the muscles in the front of your right / left thigh. You should see either your kneecap slide up toward your hip or an increased dimpling just above the knee. This motion will push the back of the knee down toward the floor, mat or bed on which you are lying.  Hold the muscle as tight as you can without increasing your pain for __________ seconds.  Relax the muscles slowly and completely in between each repetition. Repeat __________ times. Complete this exercise __________ times per day. STRENGTH-Quadriceps, Short Arcs  Lie on your back. Place a __________ inch towel roll under your knee so that the knee slightly bends.  Raise only your lower leg by tightening the muscles in the front of your thigh. Do not allow your thigh to rise.  Hold this position for __________ seconds. Repeat __________ times. Complete this exercise __________ times per day. OPTIONAL ANKLE WEIGHTS: Begin with ____________________, but DO NOT exceed ____________________. Increase in 1 pound/0.5 kilogram increments.  STRENGTH--Quadriceps, Straight Leg Raises Quality counts! Watch for signs that the quadriceps muscle is working, to be sure you are strengthening the correct muscles and not "cheating" by substituting with healthier muscles.  Lay on your back with your right / left leg extended and your opposite knee bent.  Tense the muscles in the front of your right / left thigh. You should see either your knee cap slide up or increased dimpling just above the knee. Your thigh may even shake a bit.  Tighten these muscles even more and raise your leg 4 to 6 inches off the floor. Hold for __________ seconds.  Keeping these muscles tense, lower your leg.  Relax  the muscles slowly and completely in between each repetition. Repeat __________ times. Complete this exercise __________ times per day. STRENGTH-Hamstring, Isometrics  Lie on your back on a firm surface.  Bend your right / left knee approximately __________ degrees.  Dig your heel into the surface as if you are trying to pull it toward your buttocks. Tighten the muscles in the back of your thighs to "dig" as hard as you can, without increasing any pain.  Hold this position for __________ seconds.  Release the tension gradually and allow your muscle to completely relax for __________ seconds in between each exercise. Repeat __________ times. Complete this exercise __________ times per day. STRENGTH-Hamstring, Curls  Lay on your stomach with your legs extended. (If you lay on a bed, your feet may hang over the edge.)  Tighten the muscles in the back of your thigh to bend your right / left knee up to 90 degrees. Keep your hips flat on the bed.  Hold this position for __________ seconds.  Slowly lower your leg back to the starting position. Repeat __________ times. Complete this exercise __________ times per day.   OPTIONAL ANKLE WEIGHTS: Begin with ____________________, but DO NOT exceed ____________________. Increase in 1 pound/0.5 kilogram increments.  Document Released: 09/04/2005 Document Revised: 11/27/2011 Document Reviewed: 12/17/2008 [employer] Patient Information 2015 [employer]. This information is not intended to replace advice given to you by your health care provider. Make sure you discuss any questions you have with your health care provider.  

## 2015-01-25 NOTE — Progress Notes (Signed)
   Subjective:    Patient ID: Phillip Wyatt, male    DOB: [birth year], 54 y.o.   MRN: [id number]  HPI  Pt presents to the clinic with right knee pain for 1 month. 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 Pain is localized of medial knee and worse with flexion, walking and bearing weight. He is taking ibuprofen but does not see to be helping. Hx of gout but not taking any medication to prevent or treat.   Review of Systems  All other systems reviewed and are negative.      Objective:   Physical Exam  Constitutional: He is oriented to person, place, and time. He appears well-developed and well-nourished.  Musculoskeletal:  Right knee: Slightly warm to touch medially.  Fluid wave present to represent effusion.  Flexion to about 100 degrees until pain occurs.  Full extension.  No pain over lateral joint space and posterior knee.  Pain to palpation over medial joint space and MCL. No laxity with anterior drawer.  Negative Mcmurrays.   Neurological: He is alert and oriented to person, place, and time.  Psychiatric: He has a normal mood and affect. His behavior is normal.          Assessment & Plan:  Right knee pain/osteoarthritis- I do think likely a combination osteoarthritis/effusion/MCL possible strain. Aspiration and injection attempted today. Not able to aspirate any fluid but did injection with steroid. Ordered xray. Hx of gout will check uric acid. i did palpate a fluid wave and feel like aspiration and testing of fluid would benefit pt. mobic given to take daily for next 2 weeks. Small quanity of norco given for acute pain. Encouraged compression with ACE bandage and icing regularly. Exercises given to stretch and work on [employer]CL strength. Follow up as needed.   Knee Arthrocentesis with Injection Procedure Note  Pre-operative Diagnosis: right pain OA/effusion  Post-operative Diagnosis: same  Indications: Symptom  relief from osteoarthritis  Anesthesia: ethyl chloride spray without added sodium bicarbonate  Procedure Details   Verbal consent was obtained for the procedure. The joint was prepped with alcohol swab. A 22 gauge needle was inserted into the anterior medial knee. 0 ml of synovial fluid was removed from the joint}. 9 ml 1% lidocaine and 1 ml of depo medrol 40mg  was then injected into the joint through the same needle. The needle was removed and the area cleansed and dressed.  Complications:  None; patient tolerated the procedure well.

## 2015-01-28 ENCOUNTER — Ambulatory Visit (INDEPENDENT_AMBULATORY_CARE_PROVIDER_SITE_OTHER): Payer: BLUE CROSS/BLUE SHIELD | Admitting: Sports Medicine

## 2015-01-28 ENCOUNTER — Encounter: Payer: Self-pay | Admitting: Sports Medicine

## 2015-01-28 DIAGNOSIS — M25561 Pain in right knee: Secondary | ICD-10-CM

## 2015-01-28 NOTE — Assessment & Plan Note (Signed)
Persistent right knee pain despite injection by Jade. Aspiration and injection as above. Fluid off for analysis. Formal PT. Meloxicam. Return in 4 weeks to re-eval.

## 2015-01-28 NOTE — Progress Notes (Signed)
   Subjective:    I'm seeing this patient as a consultation for:   Tandy GawJade Breeback, PA-C  CC: right knee pain  HPI: This is a pleasant 54 year old male, he comes in with a several week history of pain in his right knee, at the medial joint line and the suprapatellar recess. He had an attempted aspiration and injection that provided no relief. He does drive a truck.  pain is moderate, persistent, no mechanical symptoms, no constitutional symptoms.  Past medical history, Surgical history, Family history not pertinant except as noted below, Social history, Allergies, and medications have been entered into the medical record, reviewed, and no changes needed.   Review of Systems: No headache, visual changes, nausea, vomiting, diarrhea, constipation, dizziness, abdominal pain, skin rash, fevers, chills, night sweats, weight loss, swollen lymph nodes, body aches, joint swelling, muscle aches, chest pain, shortness of breath, mood changes, visual or auditory hallucinations.   Objective:   General: Well Developed, well nourished, and in no acute distress.  Neuro/Psych: Alert and oriented x3, extra-ocular muscles intact, able to move all 4 extremities, sensation grossly intact. Skin: Warm and dry, no rashes noted.  Respiratory: Not using accessory muscles, speaking in full sentences, trachea midline.  Cardiovascular: Pulses palpable, no extremity edema. Abdomen: Does not appear distended. Right Knee: Visible and palpable effusion with pain at the medial joint line ROM normal in flexion and extension and lower leg rotation. Ligaments with solid consistent endpoints including ACL, PCL, LCL, MCL. Negative Mcmurray's and provocative meniscal tests. Non painful patellar compression. Patellar and quadriceps tendons unremarkable. Hamstring and quadriceps strength is normal.  Procedure: Real-time Ultrasound Guided aspiration/Injection of right knee Device: GE Logiq E  Verbal informed consent obtained.    Time-out conducted.  Noted no overlying erythema, induration, or other signs of local infection.  Skin prepped in a sterile fashion.  Local anesthesia: Topical Ethyl chloride.  With sterile technique and under real time ultrasound guidance:  20 mL straw-colored fluid aspirated, syringe switched and 2 mL kenalog 40, 4 mL lidocaine injected easily. Completed without difficulty  Pain immediately resolved suggesting accurate placement of the medication.  Advised to call if fevers/chills, erythema, induration, drainage, or persistent bleeding.  Images permanently stored and available for review in the ultrasound unit.  Impression: Technically successful ultrasound guided injection.  X-rays do show medial compartment osteoarthritis.  Impression and Recommendations:   This case required medical decision making of moderate complexity.

## 2015-01-29 LAB — SYNOVIAL CELL COUNT + DIFF, W/ CRYSTALS
Crystals, Fluid: NONE SEEN
Eosinophils-Synovial: 0 % (ref 0–1)
Lymphocytes-Synovial Fld: 35 % — ABNORMAL HIGH (ref 0–20)
Monocyte/Macrophage: 13 % — ABNORMAL LOW (ref 50–90)
Neutrophil, Synovial: 52 % — ABNORMAL HIGH (ref 0–25)
WBC, Synovial: 535 cu mm — ABNORMAL HIGH (ref 0–200)

## 2015-02-01 LAB — BODY FLUID CULTURE
Gram Stain: NONE SEEN
Organism ID, Bacteria: NO GROWTH

## 2015-02-21 ENCOUNTER — Other Ambulatory Visit: Payer: Self-pay | Admitting: Physician Assistant

## 2015-02-22 ENCOUNTER — Encounter: Payer: Self-pay | Admitting: Sports Medicine

## 2015-02-22 ENCOUNTER — Ambulatory Visit (INDEPENDENT_AMBULATORY_CARE_PROVIDER_SITE_OTHER): Payer: BLUE CROSS/BLUE SHIELD | Admitting: Sports Medicine

## 2015-02-22 VITALS — BP 131/84 | HR 99 | Ht 72.0 in | Wt 276.0 lb

## 2015-02-22 DIAGNOSIS — M722 Plantar fascial fibromatosis: Secondary | ICD-10-CM

## 2015-02-22 DIAGNOSIS — M1711 Unilateral primary osteoarthritis, right knee: Secondary | ICD-10-CM

## 2015-02-22 NOTE — Assessment & Plan Note (Signed)
Completed resolved after aspiration and injection at the last visit.

## 2015-02-22 NOTE — Progress Notes (Signed)
  Subjective:    CC: followup  HPI: Phillip Wyatt returns, he has right knee osteoarthritis, at the last visit I injected his right knee with an aspiration, he returns completely pain-free. Happy with results.  Unfortunately he is starting to complain of bilateral heel pain, moderate, persistent, worse with the first few steps in the morning at the plantar aspect of the heels, no radiation.  Past medical history, Surgical history, Family history not pertinant except as noted below, Social history, Allergies, and medications have been entered into the medical record, reviewed, and no changes needed.   Review of Systems: No fevers, chills, night sweats, weight loss, chest pain, or shortness of breath.   Objective:    General: Well Developed, well nourished, and in no acute distress.  Neuro: Alert and oriented x3, extra-ocular muscles intact, sensation grossly intact.  HEENT: Normocephalic, atraumatic, pupils equal round reactive to light, neck supple, no masses, no lymphadenopathy, thyroid nonpalpable.  Skin: Warm and dry, no rashes. Cardiac: Regular rate and rhythm, no murmurs rubs or gallops, no lower extremity edema.  Respiratory: Clear to auscultation bilaterally. Not using accessory muscles, speaking in full sentences. Right Knee: Normal to inspection with no erythema or effusion or obvious bony abnormalities. Palpation normal with no warmth or joint line tenderness or patellar tenderness or condyle tenderness. ROM normal in flexion and extension and lower leg rotation. Ligaments with solid consistent endpoints including ACL, PCL, LCL, MCL. Negative Mcmurray's and provocative meniscal tests. Non painful patellar compression. Patellar and quadriceps tendons unremarkable. Hamstring and quadriceps strength is normal. Bilateral feet: No visible erythema or swelling. Range of motion is full in all directions. Strength is 5/5 in all directions. No hallux valgus. No pes cavus or pes  planus. No abnormal callus noted. No pain over the navicular prominence, or base of fifth metatarsal. No tenderness to palpation of the calcaneal insertion of plantar fascia. No pain at the Achilles insertion. No pain over the calcaneal bursa. No pain of the retrocalcaneal bursa. No tenderness to palpation over the tarsals, metatarsals, or phalanges. No hallux rigidus or limitus. No tenderness palpation over interphalangeal joints. No pain with compression of the metatarsal heads. Neurovascularly intact distally.  Impression and Recommendations:

## 2015-02-22 NOTE — Assessment & Plan Note (Signed)
Rehabilitation exercises given, NSAIDs over-the-counter. Return for custom orthotics.

## 2015-03-15 ENCOUNTER — Encounter: Payer: Self-pay | Admitting: Sports Medicine

## 2015-03-15 ENCOUNTER — Ambulatory Visit (INDEPENDENT_AMBULATORY_CARE_PROVIDER_SITE_OTHER): Payer: BLUE CROSS/BLUE SHIELD | Admitting: Sports Medicine

## 2015-03-15 VITALS — BP 121/75 | HR 97 | Wt 273.0 lb

## 2015-03-15 DIAGNOSIS — M722 Plantar fascial fibromatosis: Secondary | ICD-10-CM | POA: Diagnosis not present

## 2015-03-15 DIAGNOSIS — L603 Nail dystrophy: Secondary | ICD-10-CM | POA: Insufficient documentation

## 2015-03-15 NOTE — Assessment & Plan Note (Signed)
Return for left fifth nail plate excision with phenol.

## 2015-03-15 NOTE — Assessment & Plan Note (Signed)
Improving, custom orthotics as above.

## 2015-03-15 NOTE — Progress Notes (Signed)
    Patient was fitted for a : standard, cushioned, semi-rigid orthotic. Noted left fifth toe severe onychodystrophy  The orthotic was heated and afterward the patient stood on the orthotic blank positioned on the orthotic stand. The patient was positioned in subtalar neutral position and 10 degrees of ankle dorsiflexion in a weight bearing stance. After completion of molding, a stable base was applied to the orthotic blank. The blank was ground to a stable position for weight bearing. Size: 12 Base: [facility], hospital and Padding: None The patient ambulated these, and they were very comfortable.  I spent 40 minutes with this patient, greater than 50% was face-to-face time counseling regarding the below diagnosis.

## 2015-04-05 ENCOUNTER — Ambulatory Visit (INDEPENDENT_AMBULATORY_CARE_PROVIDER_SITE_OTHER): Payer: BLUE CROSS/BLUE SHIELD | Admitting: Sports Medicine

## 2015-04-05 ENCOUNTER — Encounter: Payer: Self-pay | Admitting: Sports Medicine

## 2015-04-05 VITALS — BP 158/96 | HR 112 | Ht 72.0 in | Wt 272.0 lb

## 2015-04-05 DIAGNOSIS — L603 Nail dystrophy: Secondary | ICD-10-CM | POA: Diagnosis not present

## 2015-04-05 MED ORDER — HYDROCODONE-ACETAMINOPHEN 5-325 MG PO TABS: 1.0000 | ORAL_TABLET | Freq: Three times a day (TID) | ORAL | Status: AC | PRN

## 2015-04-05 NOTE — Progress Notes (Signed)
  Procedure:  Removal of left fifth toenail. Risks, benefits, alternatives explained to patient. Consent obtained. Time out conducted. Noted no overlying induration or erythema at site of injection. Toe cleaned with alcohol, then a total of 5cc lidocaine 2% infiltrated at adjacent webspaces at the location of the bifurcation of the common digital nerve to proper digital nerves.  Some lidocaine also infiltrated at hyponychium and under nail bed.  Adequate anesthesia ensured. Toe prepped and draped in a sterile fashion. Nail elevator used to separate nail plate from nail bed. Hemostat then used to separate nail fragment from surrounding structures. Nail bed and matrix treated. Minor bleeding controlled with pressure and phenol. Antibiotic ointment applied. Toe dressed. Advised to return if increased redness, swelling, drainage, fevers, or chills.

## 2015-04-05 NOTE — Assessment & Plan Note (Signed)
Left fifth nail plate excision and treatment with phenol. Return in one week for a wound check.

## 2015-04-05 NOTE — Patient Instructions (Signed)
Toenail Removal Toenails may need to be removed because of injury, infections, or to correct abnormal growth. A special non-stick bandage will likely be put tightly on your toe to prevent bleeding. Often times a new nail will grow back. Sometimes the new nail may be deformed. Most of the time when a nail is lost, it will gradually heal, but may be sensitive for a long time. HOME CARE INSTRUCTIONS   Keep your foot elevated to relieve pain and swelling. This will require lying in bed or on a couch with the leg on pillows or sitting in a recliner with the leg up. Walking or letting your leg dangle may increase swelling, slow healing, and cause throbbing pain.  Keep your bandage dry and clean.  Change your bandage in 24 hours.  After your bandage is changed, soak your foot in warm, soapy water for 10 to 20 minutes. Do this 3 times per day. This helps reduce pain and swelling. After soaking your foot apply a clean, dry bandage. Change your bandage if it is wet or dirty.  Only take over-the-counter or prescription medicines for pain, discomfort, or fever as directed by your caregiver.  See your caregiver as needed for problems. You might need a tetanus shot now if:  You have no idea when you had the last one.  You have never had a tetanus shot before.  The injured area had dirt in it. If you need a tetanus shot, and you decide not to get one, there is a rare chance of getting tetanus. Sickness from tetanus can be serious. If you did get a tetanus shot, your arm may swell, get red and warm to the touch at the shot site. This is common and not a problem. SEEK IMMEDIATE MEDICAL CARE IF:   You have increased pain, swelling, redness, warmth, drainage, or bleeding.  You have a fever.  You have swelling that spreads from your toe into your foot. Document Released: 06/03/2003 Document Revised: 11/27/2011 Document Reviewed: 09/14/2008 [employer] Patient Information 2015 [employer]. This  information is not intended to replace advice given to you by your health care provider. Make sure you discuss any questions you have with your health care provider. 

## 2015-04-15 ENCOUNTER — Ambulatory Visit (INDEPENDENT_AMBULATORY_CARE_PROVIDER_SITE_OTHER): Payer: BLUE CROSS/BLUE SHIELD | Admitting: Sports Medicine

## 2015-04-15 ENCOUNTER — Encounter: Payer: Self-pay | Admitting: Sports Medicine

## 2015-04-15 VITALS — BP 145/92 | HR 112 | Temp 98.0°F

## 2015-04-15 DIAGNOSIS — I10 Essential (primary) hypertension: Secondary | ICD-10-CM | POA: Diagnosis not present

## 2015-04-15 DIAGNOSIS — L603 Nail dystrophy: Secondary | ICD-10-CM

## 2015-04-15 MED ORDER — OLMESARTAN-AMLODIPINE-HCTZ 40-10-25 MG PO TABS: 1.0000 | ORAL_TABLET | Freq: Every day | ORAL | Status: DC

## 2015-04-15 NOTE — Assessment & Plan Note (Signed)
Increasing Tribenzor to 20/10/25. Keep a blood pressure log, and follow-up with PCP in 2 weeks.

## 2015-04-15 NOTE — Progress Notes (Signed)
  Subjective:    CC: Follow-up  HPI: Left fifth onychodystrophy: Post nail plate excision with phenol treatment, doing extremely well.  Hypertension: Elevated today, currently taking Tribenzor at a moderate dose. No headaches, visual changes, chest pain.  Past medical history, Surgical history, Family history not pertinant except as noted below, Social history, Allergies, and medications have been entered into the medical record, reviewed, and no changes needed.   Review of Systems: No fevers, chills, night sweats, weight loss, chest pain, or shortness of breath.   Objective:    General: Well Developed, well nourished, and in no acute distress.  Neuro: Alert and oriented x3, extra-ocular muscles intact, sensation grossly intact.  HEENT: Normocephalic, atraumatic, pupils equal round reactive to light, neck supple, no masses, no lymphadenopathy, thyroid nonpalpable.  Skin: Warm and dry, no rashes. Cardiac: Regular rate and rhythm, no murmurs rubs or gallops, no lower extremity edema.  Respiratory: Clear to auscultation bilaterally. Not using accessory muscles, speaking in full sentences. Left foot: Fifth toe does have some scant but is not tender, no signs of bacterial infection.  Impression and Recommendations:

## 2015-04-15 NOTE — Assessment & Plan Note (Signed)
Doing extremely well post fifth nail plate removal and treatment with phenol, return as needed.

## 2015-04-19 ENCOUNTER — Other Ambulatory Visit: Payer: Self-pay | Admitting: Family Medicine

## 2015-05-31 ENCOUNTER — Ambulatory Visit (INDEPENDENT_AMBULATORY_CARE_PROVIDER_SITE_OTHER): Payer: BLUE CROSS/BLUE SHIELD | Admitting: Family Medicine

## 2015-05-31 ENCOUNTER — Encounter: Payer: Self-pay | Admitting: Family Medicine

## 2015-05-31 VITALS — BP 130/80 | HR 112 | Wt 269.0 lb

## 2015-05-31 DIAGNOSIS — M1711 Unilateral primary osteoarthritis, right knee: Secondary | ICD-10-CM | POA: Diagnosis not present

## 2015-05-31 DIAGNOSIS — I10 Essential (primary) hypertension: Secondary | ICD-10-CM | POA: Diagnosis not present

## 2015-05-31 MED ORDER — CELECOXIB 100 MG PO CAPS: 100.0000 mg | ORAL_CAPSULE | Freq: Two times a day (BID) | ORAL | Status: AC

## 2015-05-31 NOTE — Progress Notes (Signed)
CC: Phillip Wyatt is a 54 y.o. male is here for bilateral knee pain   Subjective: HPI:   intermittent bilateral knee swelling  intermittentfor the past 2 or 3 months. Symptoms have been slowly worsening the longer he is on blood pressure medication and the higher doses he takes of these. Occasionally they will be painful on the inferior medial aspect of the knee, this knee pain feels much different than his osteoarthritis pain that was experienced at the beginning of the summer. Denies any recent or remote trauma. He denies any warmth or redness when the pain or swelling is present. Pain is nonradiating. He denies any mechanical symptoms. He is taking up to 800 mg of ibuprofen multiple times of the day without much benefit.  Follow-up essential hypertension: He suspicious that his blood pressure medication is causing some swelling in the lower extremities. His brother was having the same condition and was recently started on a type of blood pressure medication that has controlled both his blood pressure and prevent swelling. Patient denies chest pain shortness of breath orthopnea nor peripheral edema   Review Of Systems Outlined In HPI  Past Medical History  Diagnosis Date  . Gout 03/20/2013  . HYPERTENSION, BENIGN ESSENTIAL 08/30/2006    Qualifier: Diagnosis of  By: Cathey Endow DO, Karen      No past surgical history on file. No family history on file.  Social History   Social History  . Marital Status: Single    Spouse Name: N/A  . Number of Children: N/A  . Years of Education: N/A   Occupational History  . Not on file.   Social History Main Topics  . Smoking status: Current Every Day Smoker -- 1.00 packs/day    Types: Cigarettes  . Smokeless tobacco: Not on file  . Alcohol Use: Not on file  . Drug Use: Not on file  . Sexual Activity: Not on file   Other Topics Concern  . Not on file   Social History Narrative     Objective: BP 130/80 mmHg  Pulse 112  Wt 269 lb (122.018  kg)  Vital signs reviewed. General: Alert and Oriented, No Acute Distress HEENT: Pupils equal, round, reactive to light. Conjunctivae clear.  External ears unremarkable.  Moist mucous membranes. Lungs: Clear and comfortable work of breathing, speaking in full sentences without accessory muscle use. Cardiac: Regular rate and rhythm.  Neuro: CN II-XII grossly intact, gait normal Right knee exam shows full-strength and range of motion. There is no swelling, redness, nor warmth overlying the knee.  No patellar crepitus. No patellar apprehension. No pain with palpation of the inferior patellar pole.  No pain or laxity with valgus nor varus stress. Anterior drawer is negative. McMurray's negative. No popliteal space tenderness or palpable mass. No medial or lateral joint line tenderness to palpation. Extremities: No peripheral edema.  Strong peripheral pulses.  Mental Status: No depression, anxiety, nor agitation. Logical though process. Skin: Warm and dry.  Assessment & Plan: Phillip Wyatt was seen today for bilateral knee pain.  Diagnoses and all orders for this visit:  HYPERTENSION, BENIGN ESSENTIAL  Primary osteoarthritis of right knee -     celecoxib (CELEBREX) 100 MG capsule; Take 1 capsule (100 mg total) by mouth 2 (two) times daily. For knee pain.   Discussed that amlodipine certainly can contribute to some swelling of the lower extremities but I suspect his swelling of the knees is most likely due to worsening osteoarthritis. Stop meloxicam begin Celebrex. He is interested in  switching blood pressure medication to what his brother is taking, I've asked him to get further details on the name of this medication and we can experiment with it. Please call me with this information within the next week.  No Follow-up on file.

## 2015-07-05 ENCOUNTER — Other Ambulatory Visit: Payer: Self-pay | Admitting: *Deleted

## 2015-07-05 DIAGNOSIS — I10 Essential (primary) hypertension: Secondary | ICD-10-CM

## 2015-07-05 MED ORDER — OLMESARTAN-AMLODIPINE-HCTZ 40-10-25 MG PO TABS: 1.0000 | ORAL_TABLET | Freq: Every day | ORAL | Status: AC

## 2015-09-09 ENCOUNTER — Ambulatory Visit (INDEPENDENT_AMBULATORY_CARE_PROVIDER_SITE_OTHER): Payer: BLUE CROSS/BLUE SHIELD | Admitting: Family Medicine

## 2015-09-09 VITALS — Temp 98.0°F

## 2015-09-09 DIAGNOSIS — Z23 Encounter for immunization: Secondary | ICD-10-CM

## 2015-09-09 NOTE — Progress Notes (Signed)
   Subjective:    Patient ID: Phillip Wyatt, male    DOB: February 28, 1961, 54 y.o.   MRN: [id number] Influenza vaccine given in RD.  No complications.  Donne AnonAmber Brynnan Rodenbaugh, CMA HPI    Review of Systems     Objective:   Physical Exam        Assessment & Plan:

## 2015-10-12 ENCOUNTER — Other Ambulatory Visit: Payer: Self-pay | Admitting: Sports Medicine

## 2017-04-19 IMAGING — CR DG KNEE COMPLETE 4+V*R*
4 series · 4 of 4 positions shown · non-contrast
Comparison: None.

CLINICAL DATA: Medial and anterior right knee pain for 1 month.

EXAM:
RIGHT KNEE - COMPLETE 4+ VIEW

[knee ap (1 of 2)]
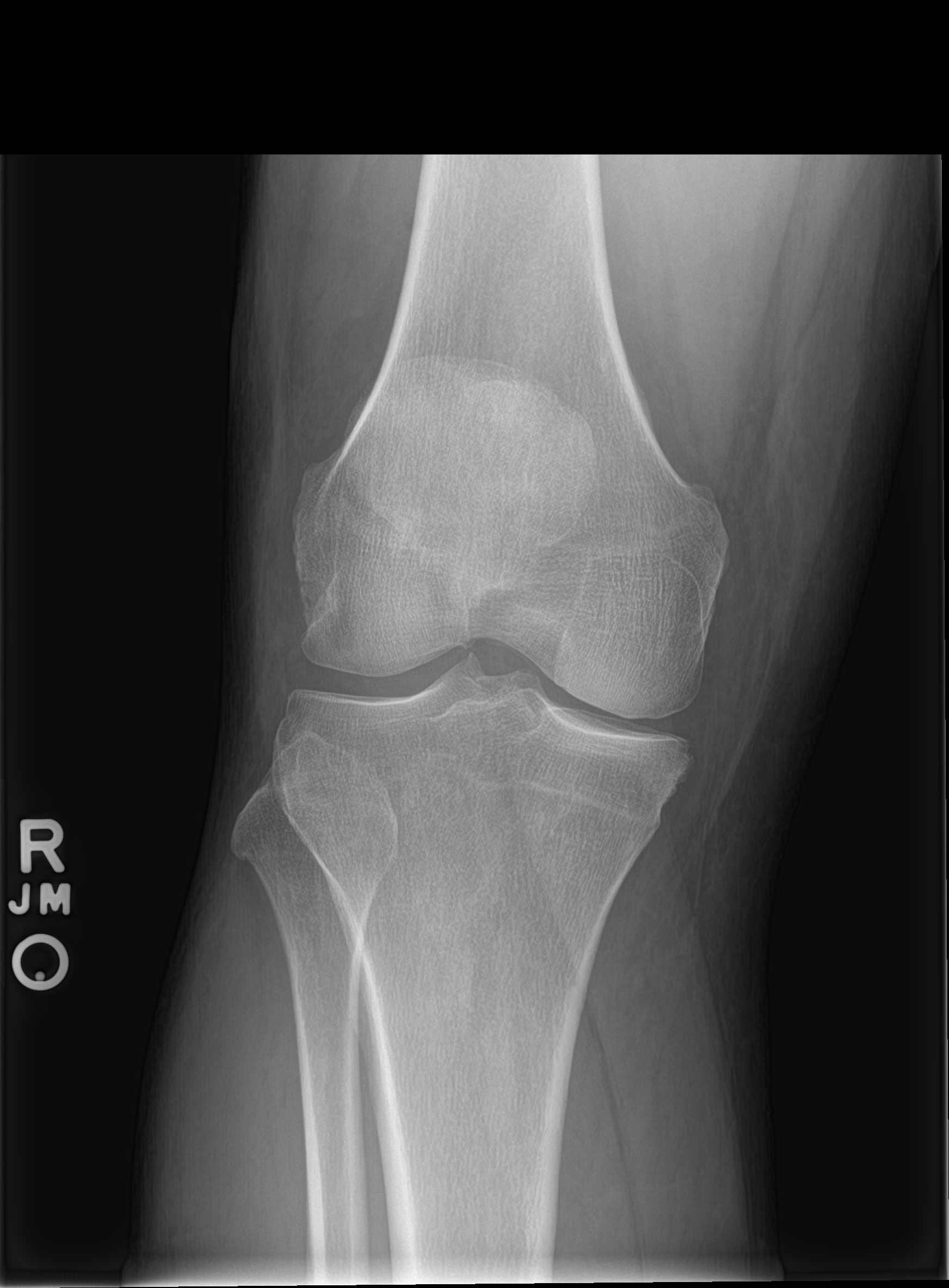

[knee lat]
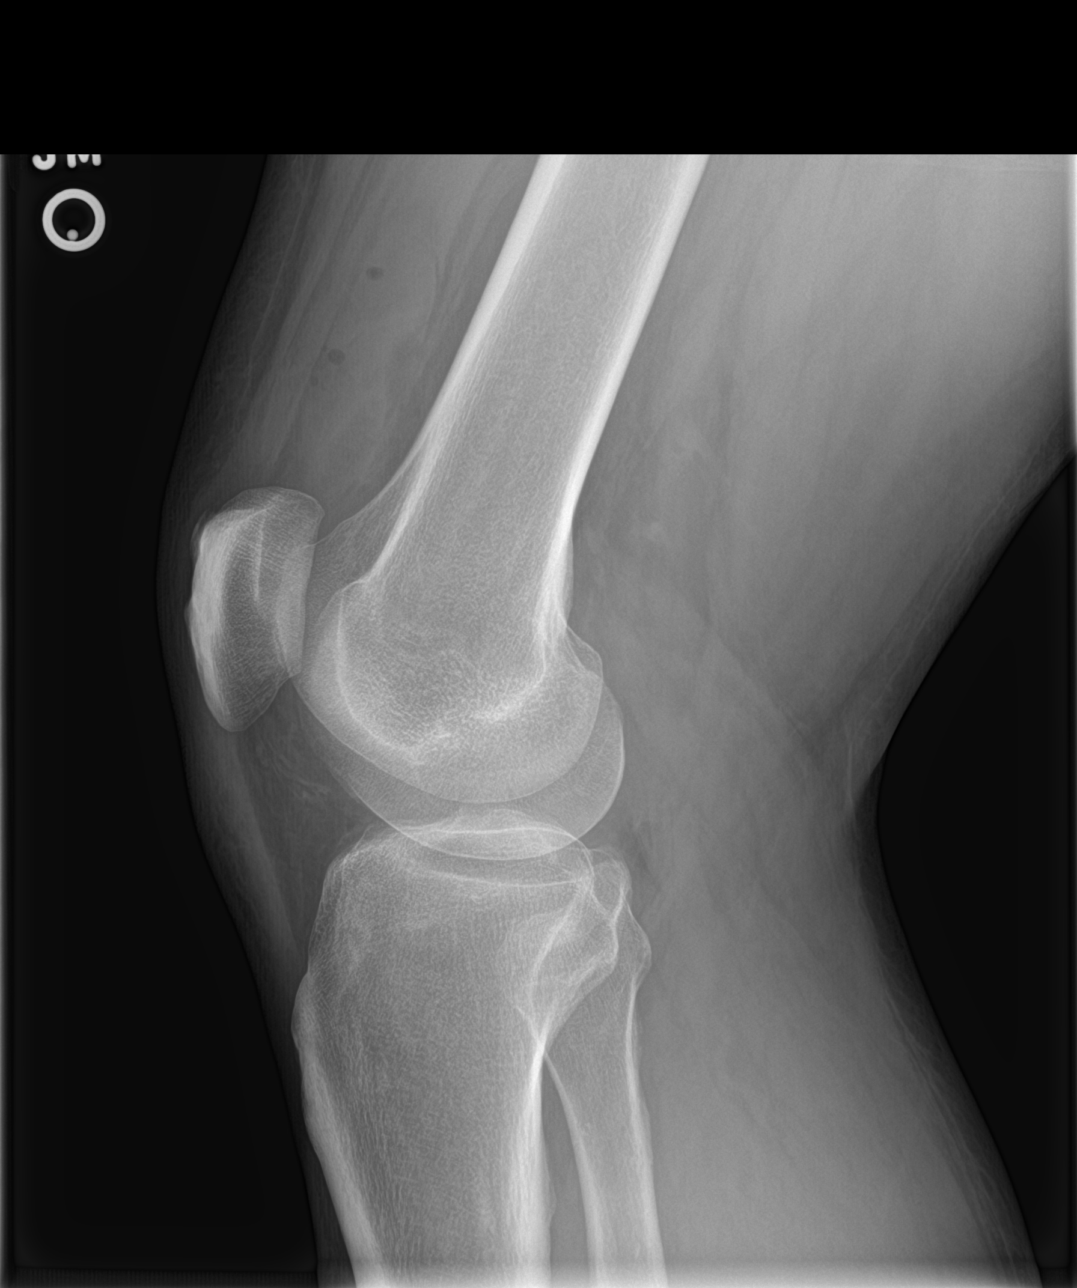

[knee sunrise]
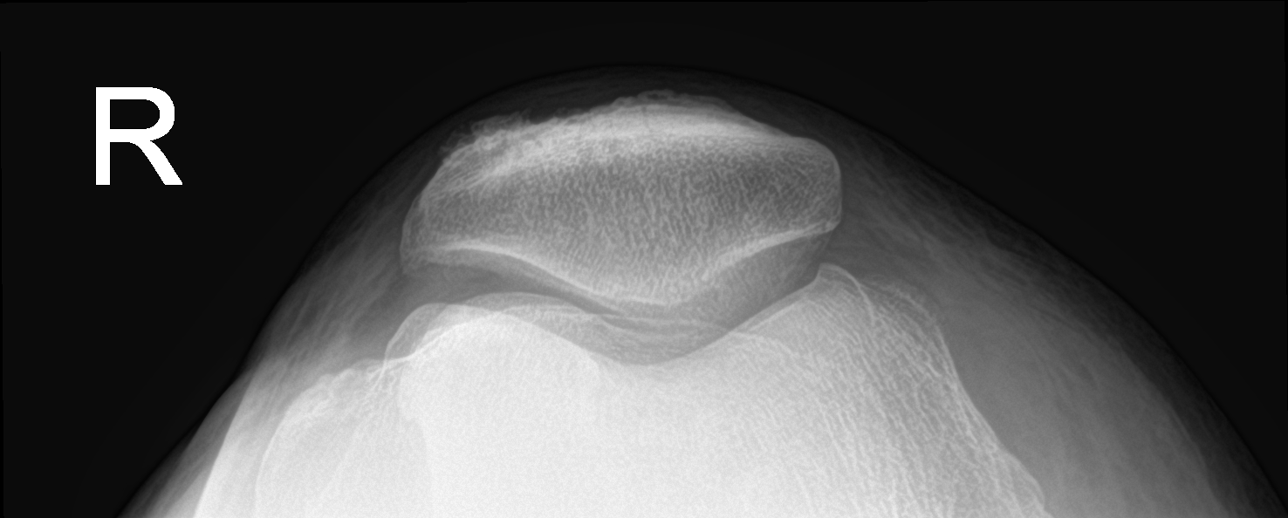

[knee ap (2 of 2)]
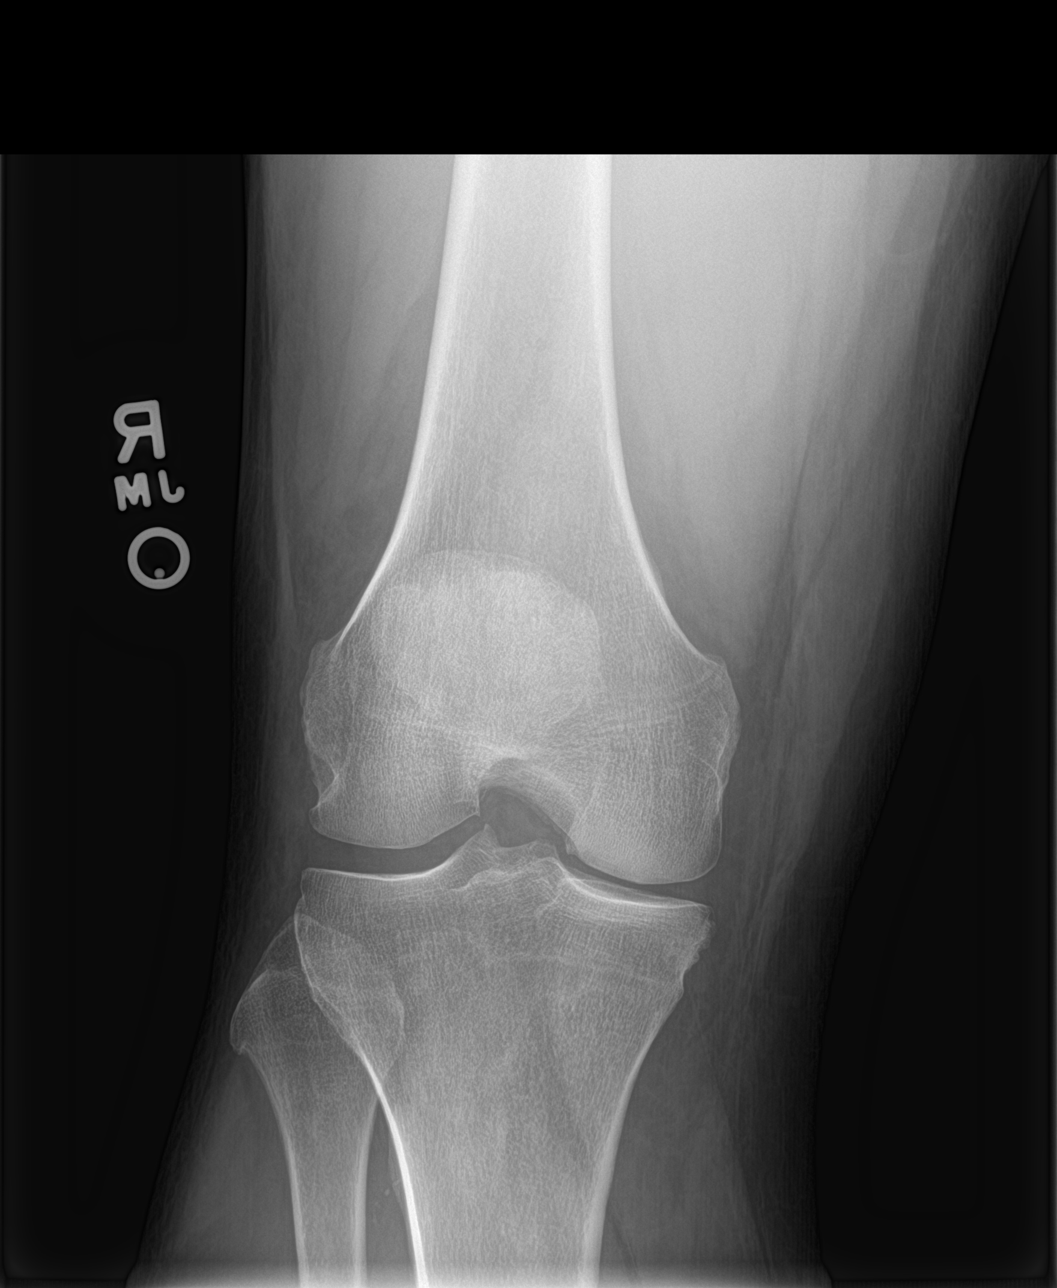

[4 of 4 positions shown; findings below may reference images not displayed]

FINDINGS: No acute fracture or dislocation is identified. There is mild medial
compartment joint space narrowing and degenerative spurring. There
is a moderately large knee joint effusion with several small rounded
lucencies measuring up to 4 mm in size projecting in the
suprapatellar bursa and suggestive of fat or less likely air within
the joint. No lytic or blastic osseous lesion is seen. No radiopaque
foreign body.
IMPRESSION: 1. Mild medial compartment osteoarthrosis.
2. Moderately large knee joint effusion containing small foci of fat
or less likely air (unless there has been recent arthrocentesis).
This is of uncertain etiology given the lack of trauma, however
lipomatous metaplasia of the synovium is a consideration. Knee MRI
or joint aspiration is suggested for further evaluation.
